# Patient Record
Sex: Male | Born: 2000 | Race: White | Hispanic: No | Marital: Single | Smoking: Never smoker
Health system: Southern US, Community
[De-identification: ages and names within clinical notes are randomized; demographics above are authoritative.]

## PROBLEM LIST (undated history)

## (undated) DIAGNOSIS — J45909 Unspecified asthma, uncomplicated: Secondary | ICD-10-CM

## (undated) DIAGNOSIS — K219 Gastro-esophageal reflux disease without esophagitis: Secondary | ICD-10-CM

## (undated) DIAGNOSIS — R51 Headache: Secondary | ICD-10-CM

## (undated) DIAGNOSIS — R519 Headache, unspecified: Secondary | ICD-10-CM

## (undated) HISTORY — PX: CIRCUMCISION: SUR203

## (undated) HISTORY — DX: Headache, unspecified: R51.9

## (undated) HISTORY — DX: Gastro-esophageal reflux disease without esophagitis: K21.9

## (undated) HISTORY — PX: COSMETIC SURGERY: SHX468

## (undated) HISTORY — DX: Headache: R51

---
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
## 2018-08-22 ENCOUNTER — Encounter (HOSPITAL_BASED_OUTPATIENT_CLINIC_OR_DEPARTMENT_OTHER): Payer: Self-pay | Admitting: *Deleted

## 2018-08-22 ENCOUNTER — Emergency Department (HOSPITAL_BASED_OUTPATIENT_CLINIC_OR_DEPARTMENT_OTHER)
Admission: EM | Admit: 2018-08-22 | Discharge: 2018-08-22 | Disposition: A | Discharge status: Home / Self Care | Payer: PRIVATE HEALTH INSURANCE | Attending: Emergency Medicine | Admitting: Emergency Medicine

## 2018-08-22 ENCOUNTER — Other Ambulatory Visit: Payer: Self-pay

## 2018-08-22 ENCOUNTER — Emergency Department (HOSPITAL_BASED_OUTPATIENT_CLINIC_OR_DEPARTMENT_OTHER): Payer: PRIVATE HEALTH INSURANCE

## 2018-08-22 DIAGNOSIS — Z79899 Other long term (current) drug therapy: Secondary | ICD-10-CM | POA: Insufficient documentation

## 2018-08-22 DIAGNOSIS — R109 Unspecified abdominal pain: Secondary | ICD-10-CM

## 2018-08-22 DIAGNOSIS — R1010 Upper abdominal pain, unspecified: Secondary | ICD-10-CM | POA: Insufficient documentation

## 2018-08-22 DIAGNOSIS — J45909 Unspecified asthma, uncomplicated: Secondary | ICD-10-CM | POA: Insufficient documentation

## 2018-08-22 LAB — COMPREHENSIVE METABOLIC PANEL
ALT: 20 U/L (ref 0–44)
AST: 23 U/L (ref 15–41)
Albumin: 4.9 g/dL (ref 3.5–5.0)
Alkaline Phosphatase: 47 U/L (ref 38–126)
Anion gap: 7 (ref 5–15)
BUN: 15 mg/dL (ref 6–20)
CHLORIDE: 103 mmol/L (ref 98–111)
CO2: 27 mmol/L (ref 22–32)
CREATININE: 0.84 mg/dL (ref 0.61–1.24)
Calcium: 9.3 mg/dL (ref 8.9–10.3)
Glucose, Bld: 114 mg/dL — ABNORMAL HIGH (ref 70–99)
POTASSIUM: 3.6 mmol/L (ref 3.5–5.1)
Sodium: 137 mmol/L (ref 135–145)
Total Bilirubin: 1 mg/dL (ref 0.3–1.2)
Total Protein: 7.6 g/dL (ref 6.5–8.1)

## 2018-08-22 LAB — CBC
HEMATOCRIT: 42.6 % (ref 39.0–52.0)
Hemoglobin: 14.9 g/dL (ref 13.0–17.0)
MCH: 29.2 pg (ref 26.0–34.0)
MCHC: 35 g/dL (ref 30.0–36.0)
MCV: 83.5 fL (ref 80.0–100.0)
NRBC: 0 % (ref 0.0–0.2)
Platelets: 193 10*3/uL (ref 150–400)
RBC: 5.1 MIL/uL (ref 4.22–5.81)
RDW: 12.9 % (ref 11.5–15.5)
WBC: 12.4 10*3/uL — AB (ref 4.0–10.5)

## 2018-08-22 LAB — URINALYSIS, ROUTINE W REFLEX MICROSCOPIC
Bilirubin Urine: NEGATIVE
Glucose, UA: NEGATIVE mg/dL
Hgb urine dipstick: NEGATIVE
KETONES UR: NEGATIVE mg/dL
LEUKOCYTES UA: NEGATIVE
NITRITE: NEGATIVE
PH: 5.5 (ref 5.0–8.0)
PROTEIN: 100 mg/dL — AB
Specific Gravity, Urine: >1.03 — ABNORMAL HIGH (ref 1.005–1.030)

## 2018-08-22 LAB — URINALYSIS, MICROSCOPIC (REFLEX): Bacteria, UA: NONE SEEN

## 2018-08-22 LAB — LIPASE, BLOOD: LIPASE: 30 U/L (ref 11–51)

## 2018-08-22 MED ORDER — LACTATED RINGERS IV BOLUS
1000.0000 mL | Freq: Once | INTRAVENOUS | Status: AC
  Administered 2018-08-22: 1000 mL via INTRAVENOUS

## 2018-08-22 MED ORDER — IOPAMIDOL (ISOVUE-300) INJECTION 61%
100.0000 mL | Freq: Once | INTRAVENOUS | Status: AC | PRN
  Administered 2018-08-22: 100 mL via INTRAVENOUS

## 2018-08-22 MED ORDER — ONDANSETRON 4 MG PO TBDP: 4.0000 mg | ORAL_TABLET | Freq: Three times a day (TID) | ORAL | 0 refills | Status: DC | PRN

## 2018-08-22 MED ORDER — SODIUM CHLORIDE 0.9% FLUSH
3.0000 mL | Freq: Once | INTRAVENOUS | Status: DC
  Filled 2018-08-22: qty 3

## 2018-08-22 MED ORDER — ONDANSETRON HCL 4 MG/2ML IJ SOLN
4.0000 mg | Freq: Once | INTRAMUSCULAR | Status: AC
  Administered 2018-08-22: 4 mg via INTRAVENOUS
  Filled 2018-08-22: qty 2

## 2018-08-22 NOTE — ED Notes (Signed)
Patient transported to CT 

## 2018-08-22 NOTE — Discharge Instructions (Addendum)
If you develop worsening, continued, or recurrent abdominal pain, uncontrolled vomiting, fever, chest or back pain, or any other new/concerning symptoms then return to the ER for evaluation.   Take tylenol and/or ibuprofen for your fever

## 2018-08-22 NOTE — ED Provider Notes (Signed)
[facility] EMERGENCY DEPARTMENT Provider Note   CSN: [id number] Arrival date & time: 08/22/18  1656     History   Chief Complaint Chief Complaint  Patient presents with  . Abdominal Pain    HPI Arun Baggarly is a 18 y.o. male.  HPI  18 year old male presents with abdominal pain.  He was sent over from urgent care to rule out appendicitis.  He states he woke up early this morning with some upper abdominal discomfort.  Took some Tums and went back to bed but when he woke back up the pain was still present.  It is about a 6 out of 10 and seems to be waxing and waning.  No nausea, vomiting, diarrhea or constipation.  He states an x-ray was obtained at urgent care and they did lab work that was concerning for infection.  The x-ray told him that he was constipated though he states he has not felt constipated.  No fevers.   Past Medical History:  Diagnosis Date  . Asthma   . Headache     Patient Active Problem List   Diagnosis Date Noted  . Postconcussion syndrome 04/28/2014    Past Surgical History:  Procedure Laterality Date  . CIRCUMCISION  2000  . FACIAL COSMETIC SURGERY  Nov. 2007   [facility]  . MOUTH SURGERY  Nov. 2007   [facility] Medications    Prior to Admission medications   Medication Sig Start Date End Date Taking? Authorizing Provider  albuterol (PROVENTIL HFA;VENTOLIN HFA) 108 (90 BASE) MCG/ACT inhaler Inhale 2 puffs into the lungs every 6 (six) hours as needed for wheezing.   Yes [provider]  amitriptyline (ELAVIL) 25 MG tablet Take 1 tablet (25 mg total) by mouth at bedtime. 06/09/14   Keturah Shavers, MD  Magnesium Oxide 500 MG TABS Take by mouth.    [provider]  Melatonin 5 MG TABS Take by mouth.    [provider]  neomycin-polymyxin-hydrocortisone (CORTISPORIN) 3.5-10000-1 otic suspension Place 3 drops into the right ear 3 (three) times daily. 01/24/13   Hayden Rasmussen, NP    promethazine (PHENERGAN) 25 MG tablet  05/31/14   [provider]  riboflavin (VITAMIN B-2) 100 MG TABS tablet Take 100 mg by mouth daily.    [provider]    Family History Family History  Problem Relation Age of Onset  . Heart Problems Maternal Grandmother        Died at 38  . Heart Problems Paternal Grandfather        Died at 55    Social History Social History   Tobacco Use  . Smoking status: Never Smoker  . Smokeless tobacco: Never Used  Substance Use Topics  . Alcohol use: No  . Drug use: No     Allergies   Patient has no known allergies.   Review of Systems Review of Systems  Constitutional: Negative for fever.  Gastrointestinal: Positive for abdominal pain. Negative for constipation, diarrhea, nausea and vomiting.  All other systems reviewed and are negative.    Physical Exam Updated Vital Signs BP 122/66 (BP Location: Right Arm)   Pulse 88   Temp 100.3 F (37.9 C) (Oral)   Resp 18   Ht 5\' 10"  (1.778 m)   Wt 87.5 kg   SpO2 100%   BMI 27.69 kg/m   Physical Exam Vitals signs and nursing note reviewed.  Constitutional:  Appearance: He is well-developed.  HENT:     Head: Normocephalic and atraumatic.     Right Ear: External ear normal.     Left Ear: External ear normal.     Nose: Nose normal.  Eyes:     General:        Right eye: No discharge.        Left eye: No discharge.  Neck:     Musculoskeletal: Neck supple.  Cardiovascular:     Rate and Rhythm: Regular rhythm. Tachycardia present.     Heart sounds: Normal heart sounds.  Pulmonary:     Effort: Pulmonary effort is normal.     Breath sounds: Normal breath sounds.  Abdominal:     Palpations: Abdomen is soft.     Tenderness: There is abdominal tenderness (mild, diffuse lower abdominal) in the right lower quadrant, suprapubic area and left lower quadrant.  Skin:    General: Skin is warm and dry.  Neurological:     Mental Status: He is alert.  Psychiatric:         Mood and Affect: Mood is not anxious.      ED Treatments / Results  Labs (all labs ordered are listed, but only abnormal results are displayed) Labs Reviewed  COMPREHENSIVE METABOLIC PANEL - Abnormal; Notable for the following components:      Result Value   Glucose, Bld 114 (*)    All other components within normal limits  CBC - Abnormal; Notable for the following components:   WBC 12.4 (*)    All other components within normal limits  URINALYSIS, ROUTINE W REFLEX MICROSCOPIC - Abnormal; Notable for the following components:   APPearance HAZY (*)    Specific Gravity, Urine >1.030 (*)    Protein, ur 100 (*)    All other components within normal limits  LIPASE, BLOOD  URINALYSIS, MICROSCOPIC (REFLEX)    EKG None  Radiology Ct Abdomen Pelvis W Contrast  Result Date: 08/22/2018 CLINICAL DATA:  Awoke with mid abdominal pain. Elevated white count. Low-grade fever. EXAM: CT ABDOMEN AND PELVIS WITH CONTRAST TECHNIQUE: Multidetector CT imaging of the abdomen and pelvis was performed using the standard protocol following bolus administration of intravenous contrast. CONTRAST:  ISOVUE-300 IOPAMIDOL (ISOVUE-300) INJECTION 61% COMPARISON:  Radiography same day FINDINGS: Lower chest: Normal Hepatobiliary: Normal Pancreas: Normal Spleen: Normal Adrenals/Urinary Tract: Adrenal glands are normal. Kidneys are normal. Bladder is normal. Stomach/Bowel: No abnormal bowel finding. No sign of obstruction or inflammation. The cecum is low-lying in the pelvis and the appendix is along the right pelvic sidewall and appears normal. Vascular/Lymphatic: Normal Reproductive: Normal Other: No free fluid or air. Musculoskeletal: Normal IMPRESSION: Normal examination. No abnormality seen to explain the presenting symptoms. Specifically, no sign of appendicitis. Low lying cecum. Patient does not appear to have an abnormal amount of fecal matter. Electronically Signed   By: Paulina Fusi M.D.   On: 08/22/2018  18:33    Procedures Procedures (including critical care time)  Medications Ordered in ED Medications  sodium chloride flush (NS) 0.9 % injection 3 mL (3 mLs Intravenous Not Given 08/22/18 1739)  ondansetron (ZOFRAN) injection 4 mg (has no administration in time range)  lactated ringers bolus 1,000 mL ( Intravenous Stopped 08/22/18 1756)  iopamidol (ISOVUE-300) 61 % injection 100 mL (100 mLs Intravenous Contrast Given 08/22/18 1759)     Initial Impression / Assessment and Plan / ED Course  I have reviewed the triage vital signs and the nursing notes.  Pertinent labs &  imaging results that were available during my care of the patient were reviewed by me and considered in my medical decision making (see chart for details).     Patient has some vague abdominal discomfort but no localizing tenderness.  Given the low-grade fever and tachycardia, CT was obtained but does not show an obvious appendicitis or other acute intra-abdominal emergency.  He is feeling nauseated currently and while he has not had diarrhea, this could be the start of a viral GI illness.  I discussed that CT imaging is not perfect and that if he were to develop new or worsening or continued symptoms he should return to the ER for evaluation.  Otherwise discharged home with antiemetics.  Final Clinical Impressions(s) / ED Diagnoses   Final diagnoses:  Abdominal pain, unspecified abdominal location    ED Discharge Orders    None       Pricilla LovelessGoldston, Henrine Hayter, MD 08/22/18 1910

## 2018-08-22 NOTE — ED Triage Notes (Addendum)
Woke with abdominal pain. He was seen at [facility] and told he is constipated and should come here to be checked for appendicitis.

## 2019-09-14 IMAGING — CT CT ABD-PELV W/ CM
2 of 4 series · 17 of 46 positions shown, 19 images · IV contrast (iopamidol)
Comparison: Radiography same day

CLINICAL DATA: Awoke with mid abdominal pain. Elevated white count.
Low-grade fever.

EXAM:
CT ABDOMEN AND PELVIS WITH CONTRAST
TECHNIQUE: Multidetector CT imaging of the abdomen and pelvis was performed
using the standard protocol following bolus administration of
intravenous contrast.
CONTRAST:  100mL 3F80HO-0EE IOPAMIDOL (3F80HO-0EE) INJECTION 61%

[Series 2: axial st · axial · 0.80mm/px · z∈[-545,-105]mm · 14 of 96 slices shown, 16 images]
[im 4/96  soft-tissue]
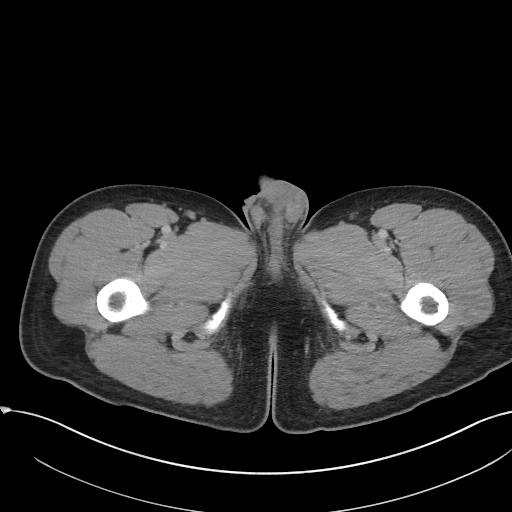
[im 4/96  bone]
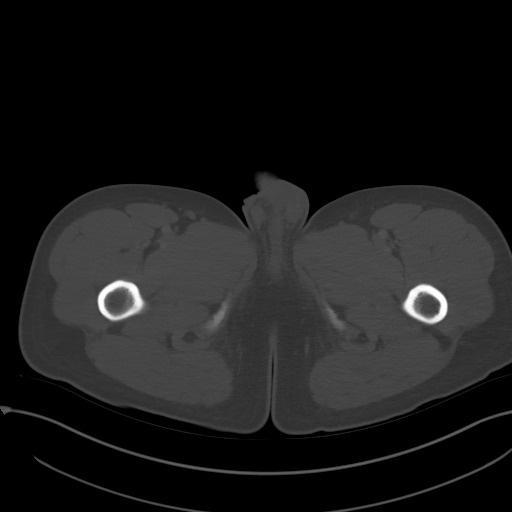
[im 12/96  soft-tissue]
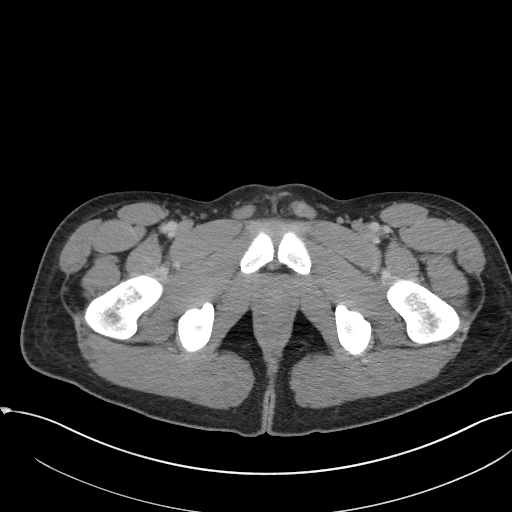
[im 20/96  soft-tissue]
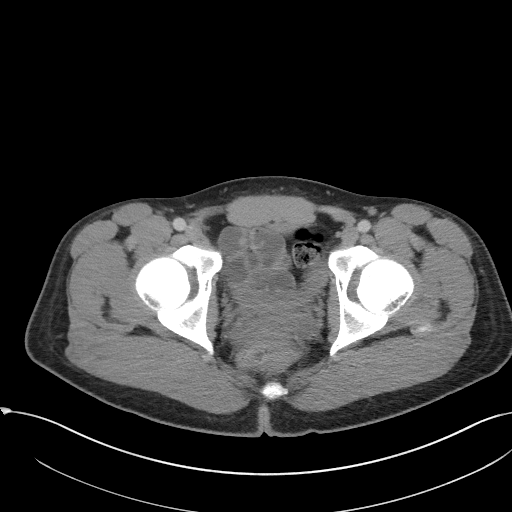
[im 27/96  soft-tissue]
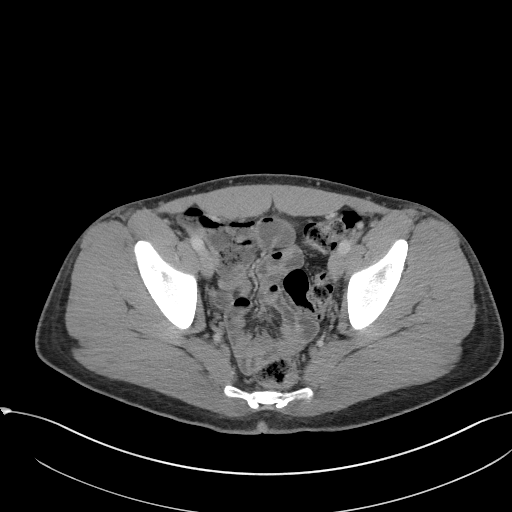
[im 31/96  soft-tissue]
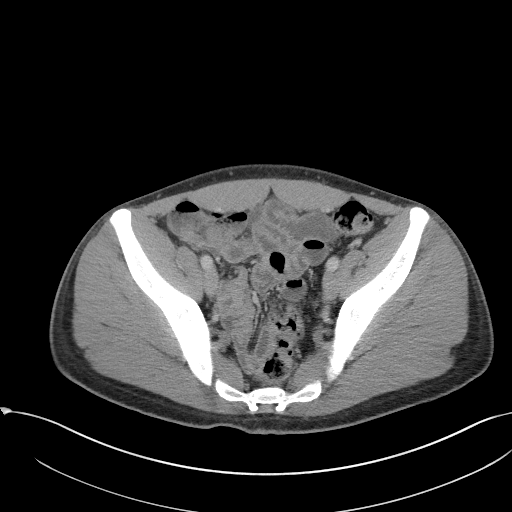
[im 39/96  soft-tissue]
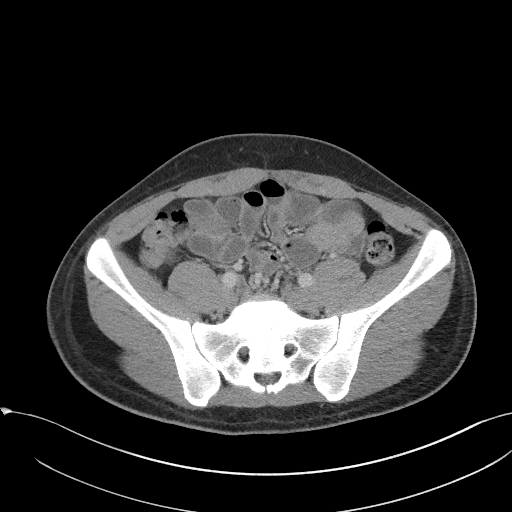
[im 46/96  soft-tissue]
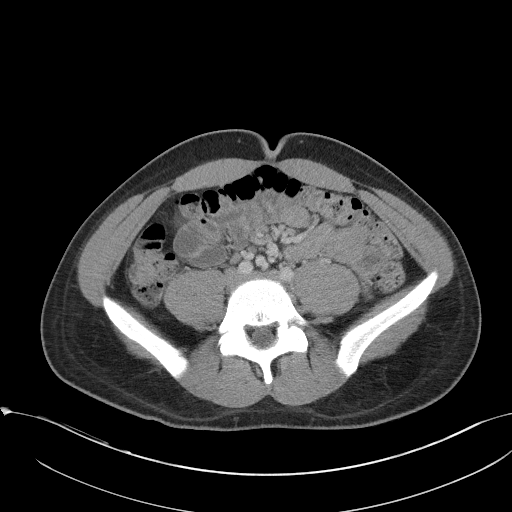
[im 50/96  soft-tissue]
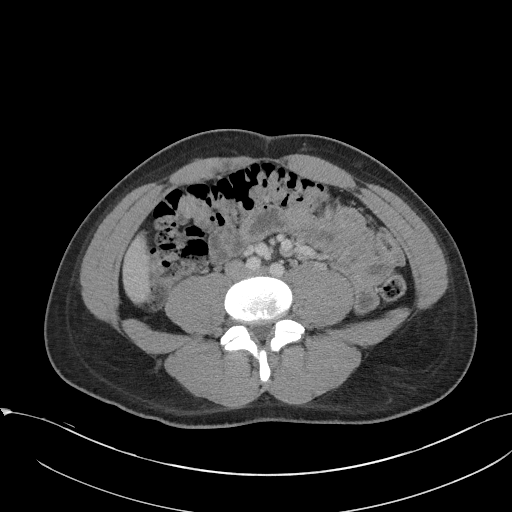
[im 58/96  soft-tissue]
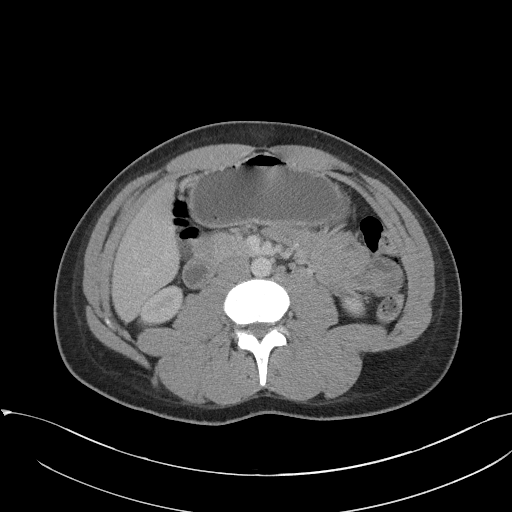
[im 58/96  bone]
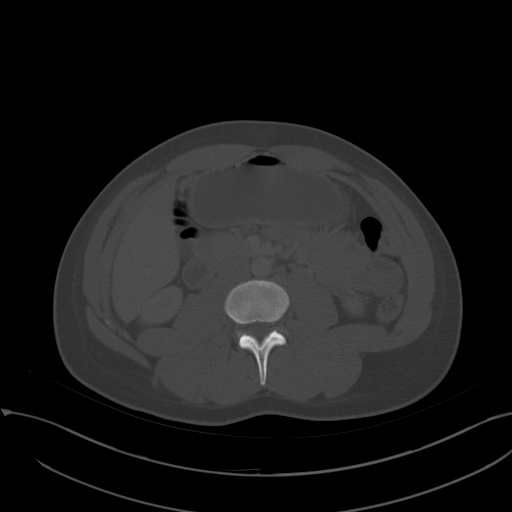
[im 65/96  soft-tissue]
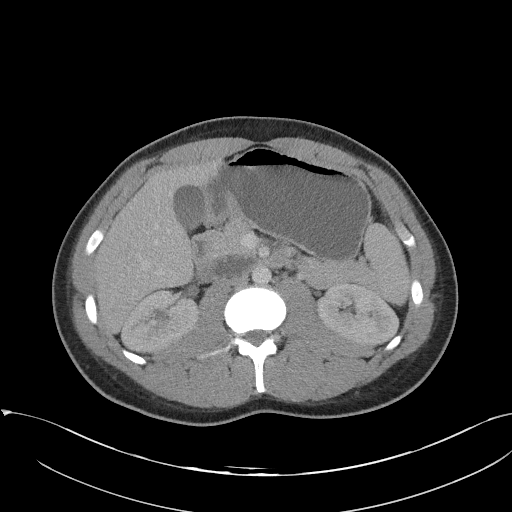
[im 73/96  soft-tissue]
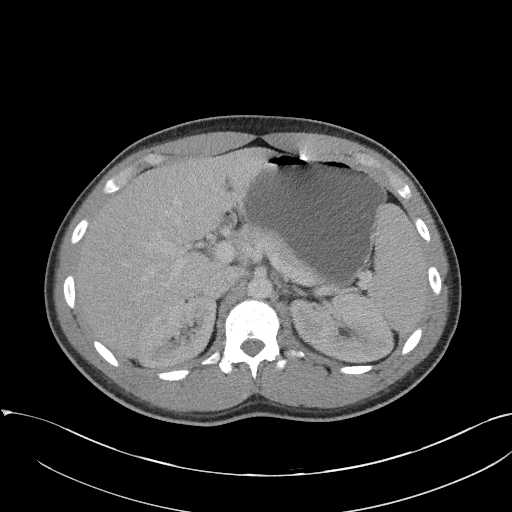
[im 77/96  soft-tissue]
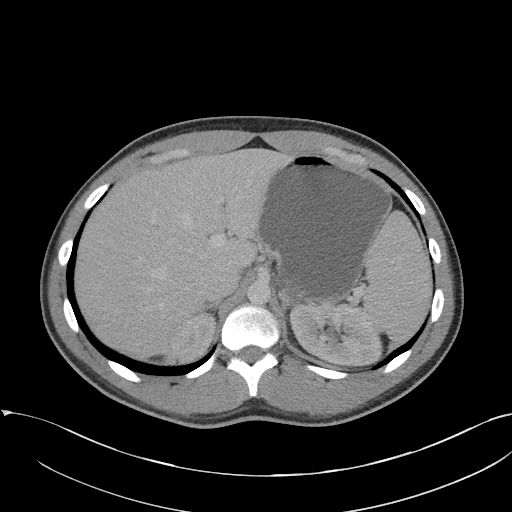
[im 84/96  soft-tissue]
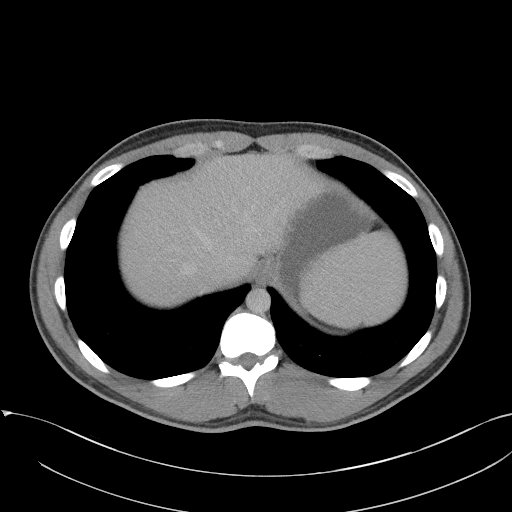
[im 92/96  soft-tissue]
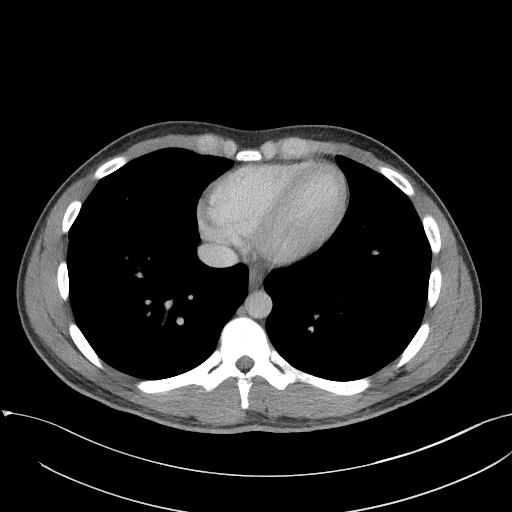

[Series 5: coronal st · coronal · 0.83mm/px · 3 of 89 slices shown]
[im 30/89  soft-tissue]
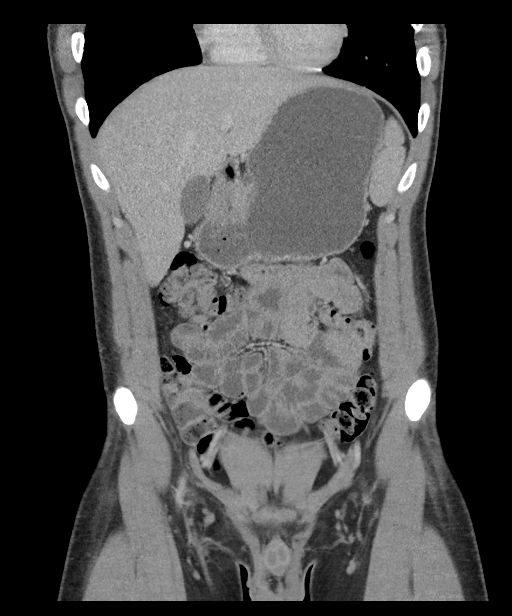
[im 40/89  soft-tissue]
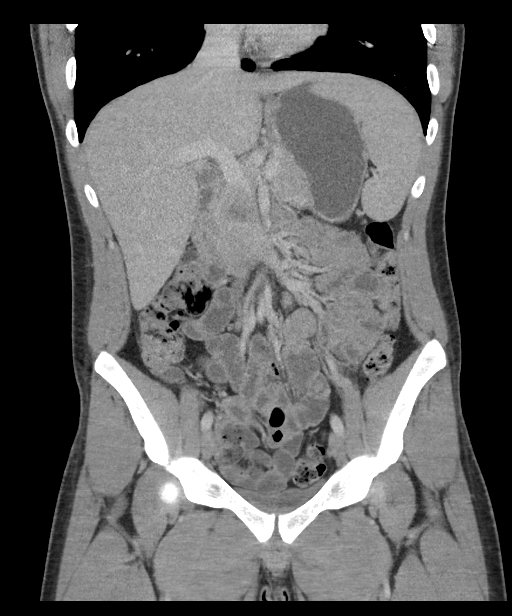
[im 49/89  soft-tissue]
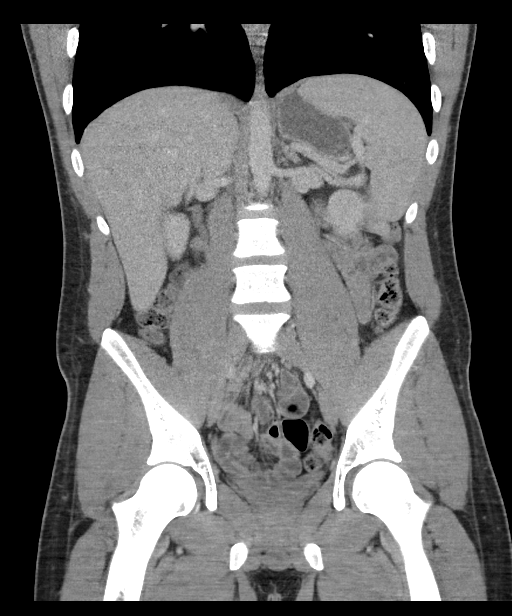

[17 of 46 positions shown; findings below may reference images not displayed]

FINDINGS: Lower chest: Normal

Hepatobiliary: Normal

Pancreas: Normal

Spleen: Normal

Adrenals/Urinary Tract: Adrenal glands are normal. Kidneys are
normal. Bladder is normal.

Stomach/Bowel: No abnormal bowel finding. No sign of obstruction or
inflammation. The cecum is low-lying in the pelvis and the appendix
is along the right pelvic sidewall and appears normal.

Vascular/Lymphatic: Normal

Reproductive: Normal

Other: No free fluid or air.

Musculoskeletal: Normal
IMPRESSION: Normal examination. No abnormality seen to explain the presenting
symptoms. Specifically, no sign of appendicitis. Low lying cecum.
Patient does not appear to have an abnormal amount of fecal matter.

## 2021-12-02 ENCOUNTER — Ambulatory Visit (HOSPITAL_COMMUNITY)
Admission: EM | Admit: 2021-12-02 | Discharge: 2021-12-02 | Disposition: A | Discharge status: Home / Self Care | Payer: Managed Care, Other (non HMO) | Attending: Physician Assistant | Admitting: Physician Assistant

## 2021-12-02 ENCOUNTER — Encounter (HOSPITAL_COMMUNITY): Payer: Self-pay | Admitting: Emergency Medicine

## 2021-12-02 DIAGNOSIS — S91331A Puncture wound without foreign body, right foot, initial encounter: Secondary | ICD-10-CM

## 2021-12-02 DIAGNOSIS — Z23 Encounter for immunization: Secondary | ICD-10-CM | POA: Diagnosis not present

## 2021-12-02 MED ORDER — TETANUS-DIPHTH-ACELL PERTUSSIS 5-2.5-18.5 LF-MCG/0.5 IM SUSY
0.5000 mL | PREFILLED_SYRINGE | Freq: Once | INTRAMUSCULAR | Status: AC
  Administered 2021-12-02: 0.5 mL via INTRAMUSCULAR

## 2021-12-02 MED ORDER — TETANUS-DIPHTH-ACELL PERTUSSIS 5-2.5-18.5 LF-MCG/0.5 IM SUSY
PREFILLED_SYRINGE | INTRAMUSCULAR | Status: AC
  Filled 2021-12-02: qty 0.5

## 2021-12-02 MED ORDER — CIPROFLOXACIN HCL 500 MG PO TABS: 500.0000 mg | ORAL_TABLET | Freq: Two times a day (BID) | ORAL | 0 refills | Status: DC

## 2021-12-02 NOTE — ED Provider Notes (Signed)
?[place] ? ? ? ?CSN: [id number] ?Arrival date & time: 12/02/21  1500 ? ? ?  ? ?History   ?Chief Complaint ?No chief complaint on file. ? ? ?HPI ?Jose Humphrey is a 21 y.o. male.  ? ?Patient presents today following puncture wound to the bottom of his right foot.  Reports that he was walking in shoes when he stepped on a nail that punctured the bottom of his shoe and into the sole of his right foot.  He is sure that the entirety of the nail was removed and has no concern for retained foreign body.  He reports pain is minimal.  He is able to ambulate unassisted.  Denies any swelling, numbness, paresthesias, fever, nausea, vomiting.  He denies any recent antibiotic use.  Denies history of diabetes or immunosuppression.  He is unsure when his last tetanus was.  Need to clean the foot with soap and water and apply hydrogen peroxide and alcohol before dressing it with a Band-Aid. ? ? ?Past Medical History:  ?Diagnosis Date  ? Asthma   ? Headache   ? ? ?Patient Active Problem List  ? Diagnosis Date Noted  ? Postconcussion syndrome 04/28/2014  ? ? ?Past Surgical History:  ?Procedure Laterality Date  ? CIRCUMCISION  2000  ? FACIAL COSMETIC SURGERY  Nov. 2007  ? [place]  Nov. 2007  ? [facility]  ? ? ? ? ? ?Home Medications   ? ?Prior to Admission medications   ?Medication Sig Start Date End Date Taking? Authorizing Provider  ?ciprofloxacin (CIPRO) 500 MG tablet Take 1 tablet (500 mg total) by mouth every 12 (twelve) hours. 12/02/21  Yes Cristino Degroff K, PA-C  ?albuterol (PROVENTIL HFA;VENTOLIN HFA) 108 (90 BASE) MCG/ACT inhaler Inhale 2 puffs into the lungs every 6 (six) hours as needed for wheezing.    [provider]  ?promethazine (PHENERGAN) 25 MG tablet  05/31/14   [provider]  ? ? ?Family History ?Family History  ?Problem Relation Age of Onset  ? Heart Problems Maternal Grandmother   ?     Died at 72  ? Heart Problems Paternal Grandfather   ?     Died  at 44  ? ? ?Social History ?Social History  ? ?Tobacco Use  ? Smoking status: Never  ? Smokeless tobacco: Never  ?Substance Use Topics  ? Alcohol use: No  ? Drug use: No  ? ? ? ?Allergies   ?Patient has no known allergies. ? ? ?Review of Systems ?Review of Systems  ?Constitutional:  Negative for activity change, appetite change, fatigue and fever.  ?Gastrointestinal:  Negative for abdominal pain, diarrhea, nausea and vomiting.  ?Musculoskeletal:  Negative for arthralgias and myalgias.  ?Skin:  Positive for wound. Negative for color change.  ?Neurological:  Negative for dizziness, weakness, light-headedness, numbness and headaches.  ? ? ?Physical Exam ?Triage Vital Signs ?ED Triage Vitals  ?Enc Vitals Group  ?   BP 12/02/21 1541 113/78  ?   Pulse Rate 12/02/21 1541 80  ?   Resp 12/02/21 1541 17  ?   Temp 12/02/21 1541 98.6 ?F (37 ?C)  ?   Temp src --   ?   SpO2 12/02/21 1541 98 %  ?   Weight --   ?   Height --   ?   Head Circumference --   ?   Peak Flow --   ?   Pain Score 12/02/21 1537 2  ?  Pain Loc --   ?   Pain Edu? --   ?   Excl. in [place]? --   ? ?No data found. ? ?Updated Vital Signs ?BP 113/78 (BP Location: Left Arm)   Pulse 80   Temp 98.6 ?F (37 ?C)   Resp 17   SpO2 98%  ? ?Visual Acuity ?Right Eye Distance:   ?Left Eye Distance:   ?Bilateral Distance:   ? ?Right Eye Near:   ?Left Eye Near:    ?Bilateral Near:    ? ?Physical Exam ?Vitals reviewed.  ?Constitutional:   ?   General: He is awake.  ?   Appearance: Normal appearance. He is well-developed. He is not ill-appearing.  ?   Comments: Very pleasant male appears stated age in no acute distress sitting comfortably in exam room  ?HENT:  ?   Head: Normocephalic and atraumatic.  ?   Mouth/Throat:  ?   Pharynx: No oropharyngeal exudate, posterior oropharyngeal erythema or uvula swelling.  ?Cardiovascular:  ?   Rate and Rhythm: Normal rate and regular rhythm.  ?   Pulses:     ?     Posterior tibial pulses are 2+ on the right side.  ?   Heart sounds: Normal  heart sounds, S1 normal and S2 normal. No murmur heard. ?   Comments: Capillary refill within 2 seconds right toes ?Pulmonary:  ?   Effort: Pulmonary effort is normal.  ?   Breath sounds: Normal breath sounds. No stridor. No wheezing, rhonchi or rales.  ?Musculoskeletal:  ?   Right lower leg: No edema.  ?   Left lower leg: No edema.  ?   Right foot: Normal range of motion. No deformity.  ?     Feet: ? ?Feet:  ?   Right foot:  ?   Protective Sensation: 10 sites tested.  10 sites sensed.  ?   Skin integrity: No ulcer, blister or skin breakdown.  ?   Toenail Condition: Right toenails are normal.  ?Neurological:  ?   Mental Status: He is alert.  ?Psychiatric:     ?   Behavior: Behavior is cooperative.  ? ? ? ?UC Treatments / Results  ?Labs ?(all labs ordered are listed, but only abnormal results are displayed) ?Labs Reviewed - No data to display ? ?EKG ? ? ?Radiology ?No results found. ? ?Procedures ?Procedures (including critical care time) ? ?Medications Ordered in UC ?Medications  ?Tdap (BOOSTRIX) injection 0.5 mL (0.5 mLs Intramuscular Given 12/02/21 1553)  ? ? ?Initial Impression / Assessment and Plan / UC Course  ?I have reviewed the triage vital signs and the nursing notes. ? ?Pertinent labs & imaging results that were available during my care of the patient were reviewed by me and considered in my medical decision making (see chart for details). ? ?  ? ?No indication for primary closure as wound is not small without active bleeding.  Will update tetanus today.  Given nail penetrated the sole of his shoe and into his foot will cover for Pseudomonas with ciprofloxacin 500 mg twice daily for 5 days.  Recommend he keep area clean with soap and water.  Discussed that if he develops any swelling, redness, pain, fever, nausea, vomiting he should be seen immediately.  Strict return precautions given to which he expressed understanding. ? ?Final Clinical Impressions(s) / UC Diagnoses  ? ?Final diagnoses:  ?Puncture wound  of plantar aspect of right foot, initial encounter  ? ? ? ?Discharge Instructions   ? ?  ?  We updated your tetanus today.  Start ciprofloxacin 500 mg twice daily for 5 days.  Keep area clean with soap and water.  If you have any worsening symptoms including drainage, bleeding, pain, swelling, fever, nausea, vomiting you should be seen immediately. ? ? ? ? ?ED Prescriptions   ? ? Medication Sig Dispense Auth. Provider  ? ciprofloxacin (CIPRO) 500 MG tablet Take 1 tablet (500 mg total) by mouth every 12 (twelve) hours. 10 tablet Veer Elamin K, PA-C  ? ?  ? ?PDMP not reviewed this encounter. ?  ?Terrilee Croak, PA-C ?12/02/21 1557 ? ?

## 2021-12-02 NOTE — ED Triage Notes (Signed)
Pt reports that stepped on nail about hour to two hours ago. Pain when walking, unsure when tetanus shot was.  ?

## 2021-12-02 NOTE — Discharge Instructions (Signed)
We updated your tetanus today.  Start ciprofloxacin 500 mg twice daily for 5 days.  Keep area clean with soap and water.  If you have any worsening symptoms including drainage, bleeding, pain, swelling, fever, nausea, vomiting you should be seen immediately. ?

## 2023-05-28 ENCOUNTER — Other Ambulatory Visit (HOSPITAL_BASED_OUTPATIENT_CLINIC_OR_DEPARTMENT_OTHER): Payer: Self-pay

## 2023-05-28 MED ORDER — FLULAVAL 0.5 ML IM SUSY
0.5000 mL | PREFILLED_SYRINGE | Freq: Once | INTRAMUSCULAR | 0 refills | Status: AC
  Filled 2023-05-28: qty 0.5, 1d supply, fill #0

## 2023-10-22 ENCOUNTER — Ambulatory Visit: Payer: PRIVATE HEALTH INSURANCE | Admitting: Physician Assistant

## 2023-11-11 ENCOUNTER — Ambulatory Visit
Admission: EM | Admit: 2023-11-11 | Discharge: 2023-11-11 | Disposition: A | Discharge status: Home / Self Care | Attending: Physician Assistant | Admitting: Physician Assistant

## 2023-11-11 ENCOUNTER — Other Ambulatory Visit: Payer: Self-pay

## 2023-11-11 DIAGNOSIS — R0789 Other chest pain: Secondary | ICD-10-CM | POA: Diagnosis not present

## 2023-11-11 DIAGNOSIS — G44209 Tension-type headache, unspecified, not intractable: Secondary | ICD-10-CM | POA: Diagnosis not present

## 2023-11-11 MED ORDER — ACETAMINOPHEN 325 MG PO TABS
650.0000 mg | ORAL_TABLET | Freq: Once | ORAL | Status: AC
  Administered 2023-11-11: 650 mg via ORAL

## 2023-11-11 MED ORDER — ALUM & MAG HYDROXIDE-SIMETH 200-200-20 MG/5ML PO SUSP
30.0000 mL | Freq: Once | ORAL | Status: AC
  Administered 2023-11-11: 30 mL via ORAL

## 2023-11-11 NOTE — ED Notes (Addendum)
 This RN made patient rounds at this time. Pt states his head is still causing him pain however his chest feels better post medication administration. Erin PA made aware.

## 2023-11-11 NOTE — ED Triage Notes (Addendum)
 Pt presents with complaints of intermittent left-sided chest pains since Friday 4/11. Pt currently rates his pain a 6/10, has a headache. OTC Tylenol taken with little relief. Shortness of breath also reported when the chest pain occurs. Describes chest pain as sharp, radiates into the neck & left shoulder.

## 2023-11-11 NOTE — ED Notes (Signed)
 This RN reviewed AVS at this time. Pt voices concerns regarding his BP reading today. [place] BP was 150/72. BP monitor at home. Plans to schedule an appointment with primary care. This RN offered to make an appointment, pt denied.

## 2023-11-11 NOTE — ED Provider Notes (Signed)
 [facility]    CSN: [id number] Arrival date & time: 11/11/23  1821      History   Chief Complaint Chief Complaint  Patient presents with   Chest Pain    HPI Jose Humphrey is a 23 y.o. male.   HPI   Patient presents today with concerns for chest pain that started Friday night 11/08/23 and recurred again the next 2 nights He reports current chest soreness and states the pain typically comes at night He reports he is not currently having the active pain that has been concerning him but reports pain in the back of his and left temple He has been able to eat and drink today without issues Reports pain is in the substernal area and feels sharp and throbbing with some pressure sensation  He reports some tension in left shoulder and also reports a headache and lightheadedness  He reports when he laid down for bed  on 11/08/23 the chest pain did not resolve until he went to sleep and when he woke up everything seemed fine but pain returned  He states this happened again last night   He does not recall ever having something like this previously  He states he does have a hx of reflux and initially thought it was this but after he developed headache and tingling in left arm he started to get more concerned  Interventions: Tylenol without much relief. He has not taken anything for pain today   Past Medical History:  Diagnosis Date   Asthma    Headache     Patient Active Problem List   Diagnosis Date Noted   Postconcussion syndrome 04/28/2014    Past Surgical History:  Procedure Laterality Date   CIRCUMCISION  01-29-99   FACIAL COSMETIC SURGERY  Nov. 2007   [facility]   MOUTH SURGERY  Nov. 2007   [facility] Medications    Prior to Admission medications   Medication Sig Start Date End Date Taking? Authorizing Provider  albuterol (PROVENTIL HFA;VENTOLIN HFA) 108 (90 BASE) MCG/ACT inhaler Inhale 2 puffs into the lungs every 6 (six)  hours as needed for wheezing.    [provider]  ciprofloxacin (CIPRO) 500 MG tablet Take 1 tablet (500 mg total) by mouth every 12 (twelve) hours. 12/02/21   Raspet, Beila Purdie K, PA-C  promethazine (PHENERGAN) 25 MG tablet  05/31/14   [provider]    Family History Family History  Problem Relation Age of Onset   Heart Problems Maternal Grandmother        Died at 68   Heart Problems Paternal Grandfather        Died at 48    Social History Social History   Tobacco Use   Smoking status: Never   Smokeless tobacco: Never  Vaping Use   Vaping status: Never Used  Substance Use Topics   Alcohol use: No   Drug use: No     Allergies   Patient has no known allergies.   Review of Systems Review of Systems  Constitutional:  Negative for chills and fever.  Eyes:  Negative for visual disturbance.  Respiratory:  Positive for shortness of breath (with chest pain).   Cardiovascular:  Positive for chest pain.  Gastrointestinal:  Negative for abdominal pain, constipation, diarrhea, nausea and vomiting.  Neurological:  Positive for light-headedness, numbness (tingling on left arm and right foot) and headaches. Negative for dizziness and speech difficulty.  Psychiatric/Behavioral:  Negative  for confusion.      Physical Exam Triage Vital Signs ED Triage Vitals  Encounter Vitals Group     BP 11/11/23 1833 (!) 151/76     Systolic BP Percentile --      Diastolic BP Percentile --      Pulse Rate 11/11/23 1833 88     Resp 11/11/23 1833 18     Temp 11/11/23 1833 98.5 F (36.9 C)     Temp Source 11/11/23 1833 Oral     SpO2 11/11/23 1833 98 %     Weight 11/11/23 1832 195 lb (88.5 kg)     Height 11/11/23 1832 5\' 10"  (1.778 m)     Head Circumference --      Peak Flow --      Pain Score 11/11/23 1831 6     Pain Loc --      Pain Education --      Exclude from Growth Chart --    No data found.  Updated Vital Signs BP (!) 150/72 (BP Location: Right Arm)   Pulse 88    Temp 98.5 F (36.9 C) (Oral)   Resp 18   Ht 5\' 10"  (1.778 m)   Wt 195 lb (88.5 kg)   SpO2 98%   BMI 27.98 kg/m   Visual Acuity Right Eye Distance:   Left Eye Distance:   Bilateral Distance:    Right Eye Near:   Left Eye Near:    Bilateral Near:     Physical Exam Vitals reviewed.  Constitutional:      General: He is awake.     Appearance: Normal appearance. He is well-developed and well-groomed.  HENT:     Head: Normocephalic and atraumatic.  Cardiovascular:     Rate and Rhythm: Normal rate and regular rhythm.     Pulses:          Radial pulses are 2+ on the right side and 2+ on the left side.     Heart sounds: Normal heart sounds. Heart sounds not distant. No murmur heard.    No friction rub. No gallop.  Pulmonary:     Effort: Pulmonary effort is normal.     Breath sounds: Normal breath sounds. No decreased breath sounds, wheezing, rhonchi or rales.  Chest:     Chest wall: No mass, tenderness or crepitus.  Musculoskeletal:     Cervical back: Normal range of motion and neck supple. Normal range of motion.     Right lower leg: No edema.     Left lower leg: No edema.  Skin:    General: Skin is warm and dry.  Neurological:     General: No focal deficit present.     Mental Status: He is alert and oriented to person, place, and time.  Psychiatric:        Mood and Affect: Mood normal.        Behavior: Behavior normal. Behavior is cooperative.      UC Treatments / Results  Labs (all labs ordered are listed, but only abnormal results are displayed) Labs Reviewed - No data to display  EKG   Radiology No results found.  Procedures ED EKG  Date/Time: 11/11/2023 6:42 PM  Performed by: Providence Crosby, PA-C Authorized by: Providence Crosby, PA-C   Previous ECG:    Previous ECG:  Unavailable Interpretation:    Interpretation: normal   Rate:    ECG rate:  80   ECG rate assessment: normal   Rhythm:  Rhythm: sinus rhythm   QRS:    QRS intervals:  Normal   QRS  conduction: normal   ST segments:    ST segments:  Normal  (including critical care time)  Medications Ordered in UC Medications  acetaminophen (TYLENOL) tablet 650 mg (650 mg Oral Given 11/11/23 1909)  alum & mag hydroxide-simeth (MAALOX/MYLANTA) 200-200-20 MG/5ML suspension 30 mL (30 mLs Oral Given 11/11/23 1909)    Initial Impression / Assessment and Plan / UC Course  I have reviewed the triage vital signs and the nursing notes.  Pertinent labs & imaging results that were available during my care of the patient were reviewed by me and considered in my medical decision making (see chart for details).      Final Clinical Impressions(s) / UC Diagnoses   Final diagnoses:  Other chest pain  Acute non intractable tension-type headache   Patient presents today with concerns for substernal chest pain for the past 3 days that appears to get worse at night as well as headache and left shoulder pain.  EKG completed and appears overall normal.  EKG shows normal sinus rhythm with rate of approximately 80 bpm and without obvious signs of ST elevation or depression.  I reviewed with patient that the EKG is not completely definitive for cardiac etiology rule out and if he is still concerned about this he should go to the emergency room for troponins.  Patient declines EMS transport and is amenable to trying GI cocktail and Tylenol to assist with current symptoms of headache and mild chest pain.  Maalox was administered  as well as Tylenol 650 mg p.o. once.  He reports improvement in chest pain symptoms but headache pain did not appear affected.  At this time I suspect his chest pain is likely secondary to GERD and reflux with presentation and HPI.  Recommend famotidine or Prilosec per preference as well as Tums or Pepto for acute flares.  At this time I suspect potential tension type headache and recommend NSAIDs once he gets home as well as warm compresses and gentle massage.  Strict ED and return  precautions were reviewed and provided in after visit summary.  Follow-up as needed    Discharge Instructions      You were seen today for chest pain and headaches as well as shortness of breath.  Your EKG is reassuring but as discussed I cannot completely rule out a cardiac cause of your symptoms without blood work.  These labs are performed in the emergency room and if you are continuing to have concerns I recommend going there for further evaluation and monitoring. At this time I suspect that you are likely having reflux a tension headache. Your chest symptoms seem to improve after you were given Maalox which is a good sign and consistent with likely GERD.  I recommend starting a preventative medication such as Pepcid or Prilosec to assist with GERD prevention.  You can continue to take Tums as needed for acute symptoms. Tension headaches are largely muscular in nature and typically respond better to medications like ibuprofen or Motrin (NSAIDs) as well as warm compresses to the neck and shoulders, gentle stretches and massage.  I recommend trying these once you get home. If your symptoms seem to be getting worse, the chest pain does not improve with GERD medications or seems to be worsening, you develop shortness of breath or trouble breathing, more severe headache, severe headache of your life, difficulty speaking, confusion, vision changes or facial drooping please  go to the emergency room as these could be signs of a medical emergency.     ED Prescriptions   None    PDMP not reviewed this encounter.   Williom Cedar E, PA-C 11/11/23 2041

## 2023-11-11 NOTE — Discharge Instructions (Addendum)
 You were seen today for chest pain and headaches as well as shortness of breath.  Your EKG is reassuring but as discussed I cannot completely rule out a cardiac cause of your symptoms without blood work.  These labs are performed in the emergency room and if you are continuing to have concerns I recommend going there for further evaluation and monitoring. At this time I suspect that you are likely having reflux a tension headache. Your chest symptoms seem to improve after you were given Maalox which is a good sign and consistent with likely GERD.  I recommend starting a preventative medication such as Pepcid or Prilosec to assist with GERD prevention.  You can continue to take Tums as needed for acute symptoms. Tension headaches are largely muscular in nature and typically respond better to medications like ibuprofen or Motrin (NSAIDs) as well as warm compresses to the neck and shoulders, gentle stretches and massage.  I recommend trying these once you get home. If your symptoms seem to be getting worse, the chest pain does not improve with GERD medications or seems to be worsening, you develop shortness of breath or trouble breathing, more severe headache, severe headache of your life, difficulty speaking, confusion, vision changes or facial drooping please go to the emergency room as these could be signs of a medical emergency.

## 2023-11-15 ENCOUNTER — Other Ambulatory Visit: Payer: Self-pay | Admitting: Medical Genetics

## 2023-11-20 ENCOUNTER — Other Ambulatory Visit

## 2023-11-22 ENCOUNTER — Ambulatory Visit: Admitting: Physician Assistant

## 2023-11-22 ENCOUNTER — Encounter: Payer: Self-pay | Admitting: Physician Assistant

## 2023-11-22 VITALS — BP 127/68 | HR 65 | Ht 70.0 in | Wt 198.6 lb

## 2023-11-22 DIAGNOSIS — G4700 Insomnia, unspecified: Secondary | ICD-10-CM | POA: Diagnosis not present

## 2023-11-22 DIAGNOSIS — R079 Chest pain, unspecified: Secondary | ICD-10-CM

## 2023-11-22 DIAGNOSIS — R002 Palpitations: Secondary | ICD-10-CM | POA: Diagnosis not present

## 2023-11-22 MED ORDER — TRAZODONE HCL 50 MG PO TABS: 25.0000 mg | ORAL_TABLET | Freq: Every evening | ORAL | 3 refills | Status: AC | PRN

## 2023-11-22 NOTE — Progress Notes (Signed)
 New patient visit   Patient: Jose Humphrey   DOB: 05-Oct-2000   22 y.o. Male  MRN: [id number] Visit Date: 11/22/2023  Today's healthcare provider: Trenton Frock, PA-C   Cc. New pt, establish care   Subjective    Jose Humphrey is a 23 y.o. male who presents today as a new patient to establish care.   Discussed the use of AI scribe software for clinical note transcription with the patient, who gave verbal consent to proceed.  History of Present Illness   The patient, with a history of asthma, presents with a chief complaint of chest pain that started a week ago. The pain was located in the chest and was severe enough to take the patient's breath away. The patient denies any associated symptoms such as nausea, sweating, or vision changes. The pain was not relieved by Tums or Tylenol , and the patient does not believe it was related to acid reflux.  He went to urgent care 4/14, was told it was acid reflux and that his blood pressure was elevated.  The patient also reports occasional heart palpitations, described as arrhythmias that last for a few seconds and then resolve.   The patient also reports chronic insomnia, with periods of one to two days without sleep.       Past Medical History:  Diagnosis Date   Asthma    GERD (gastroesophageal reflux disease)    Headache    Past Surgical History:  Procedure Laterality Date   CIRCUMCISION  2000   COSMETIC SURGERY  05/2006   FACIAL COSMETIC SURGERY  05/30/2006   [name]   MOUTH SURGERY  05/30/2006   [name]   Family Status  Relation Name Status   Mother [name] Alive   Father [name] Alive   Sister  Alive       Younger   Brother  Alive       Younger   [name] Deceased   MGF  Alive   [name] Alive   PGF  Deceased   Sister  Alive       Younger   [name] Alive  No partnership data on file   Family History  Problem Relation Age of Onset   Anxiety disorder Mother    Hypertension  Mother    Obesity Mother    Varicose Veins Mother    Hypertension Father    Obesity Father    Varicose Veins Father    Heart Problems Maternal Grandmother        Died at 33   Early death Maternal Grandmother    Cancer Paternal Grandmother    Diabetes Paternal Grandmother    Heart Problems Paternal Grandfather        Died at 66   Diabetes Paternal Uncle    Social History   Socioeconomic History   Marital status: Single    Spouse name: Not on file   Number of children: Not on file   Years of education: Not on file   Highest education level: Bachelor's degree (e.g., BA, AB, BS)  Occupational History   Not on file  Tobacco Use   Smoking status: Never   Smokeless tobacco: Never  Vaping Use   Vaping status: Never Used  Substance and Sexual Activity   Alcohol use: Not Currently    Comment: I only drink on occasions, but not recently   Drug use: No   Sexual activity: Yes    Birth control/protection: Condom  Other  Topics Concern   Not on file  Social History Narrative   Not on file   Social Drivers of Health   Financial Resource Strain: Low Risk  (11/18/2023)   Overall Financial Resource Strain (CARDIA)    Difficulty of Paying Living Expenses: Not very hard  Food Insecurity: No Food Insecurity (11/18/2023)   Hunger Vital Sign    Worried About Running Out of Food in the Last Year: Never true    Ran Out of Food in the Last Year: Never true  Transportation Needs: No Transportation Needs (11/18/2023)   PRAPARE - Administrator, Civil Service (Medical): No    Lack of Transportation (Non-Medical): No  Physical Activity: Sufficiently Active (11/18/2023)   Exercise Vital Sign    Days of Exercise per Week: 2 days    Minutes of Exercise per Session: 120 min  Stress: Stress Concern Present (11/18/2023)   [name]    Feeling of Stress : Rather much  Social Connections: Moderately Isolated (11/18/2023)    Social Connection and Isolation Panel [NHANES]    Frequency of Communication with Friends and Family: More than three times a week    Frequency of Social Gatherings with Friends and Family: More than three times a week    Attends Religious Services: 1 to 4 times per year    Active Member of [name] or Organizations: No    Attends Engineer, structural: Not on file    Marital Status: Never married   Outpatient Medications Prior to Visit  Medication Sig   albuterol (PROVENTIL HFA;VENTOLIN HFA) 108 (90 BASE) MCG/ACT inhaler Inhale 2 puffs into the lungs every 6 (six) hours as needed for wheezing.   [DISCONTINUED] ciprofloxacin  (CIPRO ) 500 MG tablet Take 1 tablet (500 mg total) by mouth every 12 (twelve) hours.   [DISCONTINUED] promethazine (PHENERGAN) 25 MG tablet    No facility-administered medications prior to visit.   No Known Allergies  Immunization History  Administered Date(s) Administered   Influenza, Seasonal, Injecte, Preservative Fre 05/28/2023   Tdap 12/02/2021    Health Maintenance  Topic Date Due   HPV VACCINES (1 - Male 3-dose series) Never done   HIV Screening  Never done   Hepatitis C Screening  Never done   Pneumococcal Vaccine 59-6 Years old (1 of 2 - PCV) Never done   COVID-19 Vaccine (1 - 2024-25 season) Never done   INFLUENZA VACCINE  02/28/2024   DTaP/Tdap/Td (2 - Td or Tdap) 12/03/2031   Meningococcal B Vaccine  Aged Out    Patient Care Team: Trenton Frock, PA-C as PCP - General (Physician Assistant)  Review of Systems  Constitutional:  Negative for fatigue and fever.  Respiratory:  Negative for cough and shortness of breath.   Cardiovascular:  Positive for chest pain and palpitations. Negative for leg swelling.  Neurological:  Negative for dizziness and headaches.  Psychiatric/Behavioral:  Positive for sleep disturbance.         Objective    BP 127/68   Pulse 65   Ht 5\' 10"  (1.778 m)   Wt 198 lb 9.6 oz (90.1 kg)   BMI 28.50 kg/m      Physical Exam Constitutional:      General: He is awake.     Appearance: He is well-developed.  HENT:     Head: Normocephalic.  Eyes:     Conjunctiva/sclera: Conjunctivae normal.  Cardiovascular:     Rate and Rhythm: Normal rate and regular  rhythm.     Heart sounds: Normal heart sounds.  Pulmonary:     Effort: Pulmonary effort is normal.     Breath sounds: Normal breath sounds.  Musculoskeletal:     Right lower leg: No edema.     Left lower leg: No edema.  Skin:    General: Skin is warm.  Neurological:     Mental Status: He is alert and oriented to person, place, and time.  Psychiatric:        Attention and Perception: Attention normal.        Mood and Affect: Mood normal.        Speech: Speech normal.        Behavior: Behavior is cooperative.     Depression Screen    11/22/2023    3:51 PM  PHQ 2/9 Scores  PHQ - 2 Score 2   No results found for any visits on 11/22/23.  Assessment & Plan     Palpitations Chest pain, unspecified type - Order labs for anemia, electrolyte imbalances, and thyroid function. - Advise taking acetaminophen  if chest pain recurs and monitor for changes. - Instruct to seek emergency care if chest pain increases in severity or is accompanied by dizziness, vision changes, sweating, or nausea.  Refer to cardiology pending labs.  -     TSH -     Lipid panel -     Hemoglobin A1c -     Comprehensive metabolic panel with GFR -     CBC with Differential/Platelet  Insomnia, unspecified type Trial of trazodone, 25- 50 mg prn qhs  -     traZODone HCl; Take 0.5-1 tablets (25-50 mg total) by mouth at bedtime as needed for sleep.  Dispense: 30 tablet; Refill: 3 Assessment and Plan      Return f/u pending labs.      Trenton Frock, PA-C  [facility] [phone] (phone) [phone] (fax)  [facility]

## 2023-11-25 ENCOUNTER — Other Ambulatory Visit: Payer: Self-pay | Admitting: Physician Assistant

## 2023-11-25 ENCOUNTER — Encounter: Payer: Self-pay | Admitting: Physician Assistant

## 2023-11-25 DIAGNOSIS — R079 Chest pain, unspecified: Secondary | ICD-10-CM

## 2023-11-25 DIAGNOSIS — R002 Palpitations: Secondary | ICD-10-CM

## 2023-11-25 LAB — CBC WITH DIFFERENTIAL/PLATELET
Basophils Absolute: 0.1 10*3/uL (ref 0.0–0.2)
Basos: 1 %
EOS (ABSOLUTE): 0.1 10*3/uL (ref 0.0–0.4)
Eos: 2 %
Hematocrit: 44.1 % (ref 37.5–51.0)
Hemoglobin: 14.1 g/dL (ref 13.0–17.7)
Immature Grans (Abs): 0 10*3/uL (ref 0.0–0.1)
Immature Granulocytes: 0 %
Lymphocytes Absolute: 2.6 10*3/uL (ref 0.7–3.1)
Lymphs: 35 %
MCH: 28.2 pg (ref 26.6–33.0)
MCHC: 32 g/dL (ref 31.5–35.7)
MCV: 88 fL (ref 79–97)
Monocytes Absolute: 0.4 10*3/uL (ref 0.1–0.9)
Monocytes: 5 %
Neutrophils Absolute: 4.2 10*3/uL (ref 1.4–7.0)
Neutrophils: 57 %
Platelets: 204 10*3/uL (ref 150–450)
RBC: 5 x10E6/uL (ref 4.14–5.80)
RDW: 13.2 % (ref 11.6–15.4)
WBC: 7.4 10*3/uL (ref 3.4–10.8)

## 2023-11-25 LAB — LIPID PANEL
Chol/HDL Ratio: 3 ratio (ref 0.0–5.0)
Cholesterol, Total: 169 mg/dL (ref 100–199)
HDL: 56 mg/dL (ref >39)
LDL Chol Calc (NIH): 104 mg/dL — ABNORMAL HIGH (ref 0–99)
Triglycerides: 41 mg/dL (ref 0–149)
VLDL Cholesterol Cal: 9 mg/dL (ref 5–40)

## 2023-11-25 LAB — COMPREHENSIVE METABOLIC PANEL WITH GFR
ALT: 9 IU/L (ref 0–44)
AST: 10 IU/L (ref 0–40)
Albumin: 4.9 g/dL (ref 4.3–5.2)
Alkaline Phosphatase: 54 IU/L (ref 44–121)
BUN/Creatinine Ratio: 18 (ref 9–20)
BUN: 16 mg/dL (ref 6–20)
Bilirubin Total: 0.5 mg/dL (ref 0.0–1.2)
CO2: 24 mmol/L (ref 20–29)
Calcium: 9.8 mg/dL (ref 8.7–10.2)
Chloride: 104 mmol/L (ref 96–106)
Creatinine, Ser: 0.9 mg/dL (ref 0.76–1.27)
Globulin, Total: 2 g/dL (ref 1.5–4.5)
Glucose: 87 mg/dL (ref 70–99)
Potassium: 4.2 mmol/L (ref 3.5–5.2)
Sodium: 143 mmol/L (ref 134–144)
Total Protein: 6.9 g/dL (ref 6.0–8.5)
eGFR: 122 mL/min/{1.73_m2} (ref >59)

## 2023-11-25 LAB — HEMOGLOBIN A1C
Est. average glucose Bld gHb Est-mCnc: 94 mg/dL
Hgb A1c MFr Bld: 4.9 % (ref 4.8–5.6)

## 2023-11-25 LAB — TSH: TSH: 1.27 u[IU]/mL (ref 0.450–4.500)

## 2023-11-26 ENCOUNTER — Ambulatory Visit: Admitting: Physician Assistant

## 2023-12-25 ENCOUNTER — Encounter: Payer: Self-pay | Admitting: Physician Assistant

## 2023-12-27 ENCOUNTER — Other Ambulatory Visit: Payer: Self-pay | Admitting: Physician Assistant

## 2023-12-27 DIAGNOSIS — G4700 Insomnia, unspecified: Secondary | ICD-10-CM

## 2023-12-27 MED ORDER — HYDROXYZINE PAMOATE 25 MG PO CAPS: 25.0000 mg | ORAL_CAPSULE | Freq: Every evening | ORAL | 1 refills | Status: DC | PRN

## 2024-01-21 ENCOUNTER — Ambulatory Visit: Admitting: Cardiology

## 2024-02-11 ENCOUNTER — Other Ambulatory Visit: Payer: Self-pay | Admitting: Physician Assistant

## 2024-02-11 DIAGNOSIS — G4700 Insomnia, unspecified: Secondary | ICD-10-CM

## 2024-02-13 ENCOUNTER — Encounter: Payer: Self-pay | Admitting: Cardiology

## 2024-02-13 ENCOUNTER — Ambulatory Visit: Attending: Cardiology | Admitting: Cardiology

## 2024-02-13 ENCOUNTER — Ambulatory Visit: Attending: Cardiology

## 2024-02-13 ENCOUNTER — Telehealth (HOSPITAL_COMMUNITY): Payer: Self-pay | Admitting: *Deleted

## 2024-02-13 VITALS — BP 116/70 | HR 75 | Ht 70.0 in | Wt 209.0 lb

## 2024-02-13 DIAGNOSIS — R0789 Other chest pain: Secondary | ICD-10-CM

## 2024-02-13 DIAGNOSIS — R079 Chest pain, unspecified: Secondary | ICD-10-CM | POA: Diagnosis not present

## 2024-02-13 DIAGNOSIS — R002 Palpitations: Secondary | ICD-10-CM | POA: Diagnosis not present

## 2024-02-13 DIAGNOSIS — J4520 Mild intermittent asthma, uncomplicated: Secondary | ICD-10-CM

## 2024-02-13 NOTE — Patient Instructions (Signed)
 Medication Instructions:  Your physician recommends that you continue on your current medications as directed. Please refer to the Current Medication list given to you today.  *If you need a refill on your cardiac medications before your next appointment, please call your pharmacy*   Lab Work: None Ordered If you have labs (blood work) drawn today and your tests are completely normal, you will receive your results only by: MyChart Message (if you have MyChart) OR A paper copy in the mail If you have any lab test that is abnormal or we need to change your treatment, we will call you to review the results.   Testing/Procedures: Your physician has requested that you have an exercise tolerance test.  Eat a light breakfast  Dress to Exercise   WHY IS MY DOCTOR PRESCRIBING ZIO? The Zio system is proven and trusted by physicians to detect and diagnose irregular heart rhythms -- and has been prescribed to hundreds of thousands of patients.  The FDA has cleared the Zio system to monitor for many different kinds of irregular heart rhythms. In a study, physicians were able to reach a diagnosis 90% of the time with the Zio system1.  You can wear the Zio monitor -- a small, discreet, comfortable patch -- during your normal day-to-day activity, including while you sleep, shower, and exercise, while it records every single heartbeat for analysis.  1Barrett, P., et al. Comparison of 24 Hour Holter Monitoring Versus 14 Day Novel Adhesive Patch Electrocardiographic Monitoring. American Journal of Medicine, 2014.  ZIO VS. HOLTER MONITORING The Zio monitor can be comfortably worn for up to 14 days. Holter monitors can be worn for 24 to 48 hours, limiting the time to record any irregular heart rhythms you may have. Zio is able to capture data for the 51% of patients who have their first symptom-triggered arrhythmia after 48 hours.1  LIVE WITHOUT RESTRICTIONS The Zio ambulatory cardiac monitor is a  small, unobtrusive, and water-resistant patch--you might even forget you're wearing it. The Zio monitor records and stores every beat of your heart, whether you're sleeping, working out, or showering.     Follow-Up: At [facility], you and your health needs are our priority.  As part of our continuing mission to provide you with exceptional heart care, we have created designated Provider Care Teams.  These Care Teams include your primary Cardiologist (physician) and Advanced Practice Providers (APPs -  Physician Assistants and Nurse Practitioners) who all work together to provide you with the care you need, when you need it.  We recommend signing up for the patient portal called MyChart.  Sign up information is provided on this After Visit Summary.  MyChart is used to connect with patients for Virtual Visits (Telemedicine).  Patients are able to view lab/test results, encounter notes, upcoming appointments, etc.  Non-urgent messages can be sent to your provider as well.   To learn more about what you can do with MyChart, go to [email or web address].    Your next appointment:   3 month(s)  The format for your next appointment:   In Person  Provider:   Lamar Fitch, MD    Other Instructions NA

## 2024-02-13 NOTE — Telephone Encounter (Signed)
 Spoke to patient as a reminder about his ETT on 02/20/24 at 10:30.

## 2024-02-13 NOTE — Progress Notes (Signed)
 Cardiology Consultation:    Date:  02/13/2024   ID:  Jose Humphrey, DOB July 16, 2001, MRN [id number]  PCP:  Cyndi Shaver, PA-C  Cardiologist:  Lamar Fitch, MD   Referring MD: Cyndi Shaver, PA-C     History of Present Illness:    Jose Humphrey is a 23 y.o. male who is being seen today for the evaluation of chest pain palpitations at the request of Cyndi Shaver, PA-C.  Past medical history significant for asthma he uses a nebulizer but very rarely, he was referred to us  because of episode of chest pain.  He said that happens for last few years usually when he wakes up he will develop this tightness in the chest lasting for up to 1 hour maybe a little shortness of breath but not much swelling.  He simply get ready for work and school and then all symptoms goes away interestingly at the same time he is able to exercise lift weights and have no difficulty doing it.  Described also to have palpitations that would mean by that is his heart speeding up that happened a few times a week making her uncomfortable but no passing out but he feels dizzy sometimes with it.  He does not smoke does not exercise on the regular basis not on any special diet and overall is healthy does not have high blood pressure diabetes he is cholesterol LDL 104 which was checked in April of this year.  Does not have family history of premature coronary artery disease  Past Medical History:  Diagnosis Date   Asthma    GERD (gastroesophageal reflux disease)    Headache     Past Surgical History:  Procedure Laterality Date   CIRCUMCISION  2000   COSMETIC SURGERY  05/2006   FACIAL COSMETIC SURGERY  05/30/2006   [facility]   MOUTH SURGERY  05/30/2006   [facility]    Current Medications: Current Meds  Medication Sig   albuterol (PROVENTIL HFA;VENTOLIN HFA) 108 (90 BASE) MCG/ACT inhaler Inhale 2 puffs into the lungs every 6 (six) hours as needed for wheezing.   hydrOXYzine   (VISTARIL ) 25 MG capsule Take 1 capsule (25 mg total) by mouth at bedtime as needed.   traZODone  (DESYREL ) 50 MG tablet Take 0.5-1 tablets (25-50 mg total) by mouth at bedtime as needed for sleep.     Allergies:   Patient has no known allergies.   Social History   Socioeconomic History   Marital status: Single    Spouse name: Not on file   Number of children: Not on file   Years of education: Not on file   Highest education level: Bachelor's degree (e.g., BA, AB, BS)  Occupational History   Not on file  Tobacco Use   Smoking status: Never   Smokeless tobacco: Never  Vaping Use   Vaping status: Never Used  Substance and Sexual Activity   Alcohol use: Not Currently    Comment: I only drink on occasions, but not recently   Drug use: No   Sexual activity: Yes    Birth control/protection: Condom  Other Topics Concern   Not on file  Social History Narrative   Not on file   Social Drivers of Health   Financial Resource Strain: Low Risk  (11/18/2023)   Overall Financial Resource Strain (CARDIA)    Difficulty of Paying Living Expenses: Not very hard  Food Insecurity: No Food Insecurity (11/18/2023)   Hunger Vital Sign    Worried About Running  Out of Food in the Last Year: Never true    Ran Out of Food in the Last Year: Never true  Transportation Needs: No Transportation Needs (11/18/2023)   PRAPARE - Administrator, Civil Service (Medical): No    Lack of Transportation (Non-Medical): No  Physical Activity: Sufficiently Active (11/18/2023)   Exercise Vital Sign    Days of Exercise per Week: 2 days    Minutes of Exercise per Session: 120 min  Stress: Stress Concern Present (11/18/2023)   [name]    Feeling of Stress : Rather much  Social Connections: Moderately Isolated (11/18/2023)   Social Connection and Isolation Panel    Frequency of Communication with Friends and Family: More than three times a  week    Frequency of Social Gatherings with Friends and Family: More than three times a week    Attends Religious Services: 1 to 4 times per year    Active Member of [name] or Organizations: No    Attends Engineer, structural: Not on file    Marital Status: Never married     Family History: The patient's family history includes Anxiety disorder in his mother; Cancer in his paternal grandmother; Diabetes in his paternal grandmother and paternal uncle; Early death in his maternal grandmother; Heart Problems in his maternal grandmother and paternal grandfather; Hypertension in his father and mother; Obesity in his father and mother; Varicose Veins in his father and mother. ROS:   Please see the history of present illness.    All 14 point review of systems negative except as described per history of present illness.  EKGs/Labs/Other Studies Reviewed:    The following studies were reviewed today:   EKG:  EKG Interpretation Date/Time:  Thursday February 13 2024 08:32:29 EDT Ventricular Rate:  79 PR Interval:  172 QRS Duration:  74 QT Interval:  352 QTC Calculation: 403 R Axis:   21  Text Interpretation: Normal sinus rhythm Normal ECG When compared with ECG of 11-Nov-2023 18:41, No significant change was found Confirmed by Bernie Charleston [phone]) on 02/13/2024 8:35:00 AM    Recent Labs: 11/22/2023: ALT 9; BUN 16; Creatinine, Ser 0.90; Hemoglobin 14.1; Platelets 204; Potassium 4.2; Sodium 143; TSH 1.270  Recent Lipid Panel    Component Value Date/Time   CHOL 169 11/22/2023 1614   TRIG 41 11/22/2023 1614   HDL 56 11/22/2023 1614   CHOLHDL 3.0 11/22/2023 1614   LDLCALC 104 (H) 11/22/2023 1614    Physical Exam:    VS:  BP 116/70 (BP Location: Right Arm, Patient Position: Sitting)   Pulse 75   Ht 5' 10 (1.778 m)   Wt 209 lb (94.8 kg)   SpO2 97%   BMI 29.99 kg/m     Wt Readings from Last 3 Encounters:  02/13/24 209 lb (94.8 kg)  11/22/23 198 lb 9.6 oz (90.1 kg)   11/11/23 195 lb (88.5 kg)     GEN:  Well nourished, well developed in no acute distress HEENT: Normal NECK: No JVD; No carotid bruits LYMPHATICS: No lymphadenopathy CARDIAC: RRR, no murmurs, no rubs, no gallops RESPIRATORY:  Clear to auscultation without rales, wheezing or rhonchi  ABDOMEN: Soft, non-tender, non-distended MUSCULOSKELETAL:  No edema; No deformity  SKIN: Warm and dry NEUROLOGIC:  Alert and oriented x 3 PSYCHIATRIC:  Normal affect   ASSESSMENT:    1. Chest pain, unspecified type   2. Atypical chest pain   3. Palpitations   4.  Mild intermittent asthma without complication    PLAN:    In order of problems listed above:  Chest pain with very atypical characteristic I told him and explained him to have very low level suspicion of this coronary disease, for completion I will ask him to have exercise stress test done to make sure he does not have any ischemia even though I have low level suspicion. Palpitations: Will do Zio patch on him to see if he get any significant arrhythmia. Asthma stable very rarely uses MDI. Cholesterol LDL 104 HDL 56 continue present management   Medication Adjustments/Labs and Tests Ordered: Current medicines are reviewed at length with the patient today.  Concerns regarding medicines are outlined above.  Orders Placed This Encounter  Procedures   EKG 12-Lead   No orders of the defined types were placed in this encounter.   Signed, Lamar DOROTHA Fitch, MD, [facility]. 02/13/2024 8:53 AM    [facility]

## 2024-02-20 ENCOUNTER — Ambulatory Visit (HOSPITAL_COMMUNITY)
Admission: RE | Admit: 2024-02-20 | Discharge: 2024-02-20 | Disposition: A | Discharge status: Home / Self Care | Source: Ambulatory Visit | Attending: Cardiology | Admitting: Cardiology

## 2024-02-20 DIAGNOSIS — R079 Chest pain, unspecified: Secondary | ICD-10-CM | POA: Insufficient documentation

## 2024-02-20 LAB — EXERCISE TOLERANCE TEST
Angina Index: 0
Duke Treadmill Score: 15
Estimated workload: 17.5
Exercise duration (min): 15 min
Exercise duration (sec): 0 s
MPHR: 195 {beats}/min
Peak HR: 190 {beats}/min
Percent HR: 97 %
Rest HR: 72 {beats}/min
ST Depression (mm): 0 mm

## 2024-02-24 ENCOUNTER — Encounter (HOSPITAL_BASED_OUTPATIENT_CLINIC_OR_DEPARTMENT_OTHER): Payer: Self-pay | Admitting: Adult Health

## 2024-02-24 ENCOUNTER — Ambulatory Visit (HOSPITAL_BASED_OUTPATIENT_CLINIC_OR_DEPARTMENT_OTHER): Admitting: Adult Health

## 2024-02-24 VITALS — BP 117/70 | HR 57 | Ht 70.0 in | Wt 213.4 lb

## 2024-02-24 DIAGNOSIS — G4700 Insomnia, unspecified: Secondary | ICD-10-CM

## 2024-02-24 DIAGNOSIS — R400 Somnolence: Secondary | ICD-10-CM | POA: Diagnosis not present

## 2024-02-24 NOTE — Patient Instructions (Signed)
 Set up for Home sleep study.  Work on healthy sleep regimen  Do not drive if sleepy.  Activity and exercise daily as able  Follow up in 6 weeks to discuss results and treatment plan

## 2024-02-24 NOTE — Progress Notes (Signed)
 @Patient  ID: Jose Humphrey, male    DOB: 2001/02/16, 23 y.o.   MRN: [id number]  Chief Complaint  Patient presents with   Consult    Sleep consult     Referring provider: Cyndi Shaver, DEVONNA  HPI: 23 yo male presents for sleep consult 02/24/24 for daytime sleepiness and chronic insomnia   TEST/EVENTS :   02/24/2024 Sleep consult  Discussed the use of AI scribe software for clinical note transcription with the patient, who gave verbal consent to proceed.  History of Present Illness Jose Humphrey is a 23 year old male who presents for sleep consult with chronic insomnia and daytime sleepiness   He has a lifelong history of poor sleep, which has progressively worsened. As a child, he stayed up late, but now he experiences difficulty both falling asleep and maintaining sleep. His sleep pattern is erratic, with periods of adequate sleep followed by several days of insomnia. He typically goes to bed between 9 and 11 PM, but it takes him until midnight or 1 AM to fall asleep. He wakes up at 7:45 AM, often feeling unrested unless he wakes up in the middle of the night to use the bathroom. He has tried trazodone  for sleep without success and is currently using hydroxyzine , which initially helps but loses effectiveness with consecutive use. He is attempting to wean off hydroxyzine . He does not use caffeine regularly and does not take daytime naps. He experiences mild symptoms of restless legs at night.   He has noticed recent palpitations, which he began to pay attention to after experiencing chest problems in April. He has undergone cardiac work up with stress test and is currently wearing a monitor, though results are not yet available.  His family history includes a father who likely has sleep apnea due to snoring. He has lost 70 pounds after previously gaining weight due to lifestyle factors.  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 He does not smoke, uses alcohol socially, and denies drug use. He is single with no children. He engages in weightlifting twice a week and occasionally does cardio exercises.     02/24/2024    8:00 AM  Results of the Epworth flowsheet  Sitting and reading 0  Watching TV 1  Sitting, inactive in a public place (e.g. a theatre or a meeting) 3  As a passenger in a car for an hour without a break 3  Lying down to rest in the afternoon when circumstances permit 2  Sitting and talking to someone 0  Sitting quietly after a lunch without alcohol 1  In a car, while stopped for a few minutes in traffic 0  Total score 10     Past Surgical History:  Procedure Laterality Date   CIRCUMCISION  2000   COSMETIC SURGERY  05/2006   FACIAL COSMETIC SURGERY  05/30/2006   [facility]   MOUTH SURGERY  05/30/2006   [facility]       No Known Allergies  Immunization History  Administered Date(s) Administered   Influenza, Seasonal, Injecte, Preservative Fre 05/28/2023   Tdap 12/02/2021    Past Medical History:  Diagnosis Date   Asthma    GERD (gastroesophageal reflux disease)    Headache     Tobacco History: Social History   Tobacco Use  Smoking Status Never   Passive exposure: Past  Smokeless Tobacco Never   Counseling given: Not Answered   Outpatient Medications Prior  to Visit  Medication Sig Dispense Refill   albuterol (PROVENTIL HFA;VENTOLIN HFA) 108 (90 BASE) MCG/ACT inhaler Inhale 2 puffs into the lungs every 6 (six) hours as needed for wheezing.     hydrOXYzine  (VISTARIL ) 25 MG capsule Take 1 capsule (25 mg total) by mouth at bedtime as needed. 30 capsule 1   traZODone  (DESYREL ) 50 MG tablet Take 0.5-1 tablets (25-50 mg total) by mouth at bedtime as needed for sleep. 30 tablet 3   No facility-administered medications prior to visit.     Review of Systems:   Constitutional:   No  weight loss, night sweats,  Fevers, chills, fatigue, or   lassitude.  HEENT:   No headaches,  Difficulty swallowing,  Tooth/dental problems, or  Sore throat,                No sneezing, itching, ear ache, nasal congestion, post nasal drip,   CV:  No chest pain,  Orthopnea, PND, swelling in lower extremities, anasarca, dizziness, palpitations, syncope.   GI  No heartburn, indigestion, abdominal pain, nausea, vomiting, diarrhea, change in bowel habits, loss of appetite, bloody stools.   Resp:   No wheezing.  No chest wall deformity  Skin: no rash or lesions.  GU: no dysuria, change in color of urine, no urgency or frequency.  No flank pain, no hematuria   MS:  No joint pain or swelling.  No decreased range of motion.  No back pain.    Physical Exam  BP 117/70 (BP Location: Left Arm, Patient Position: Sitting, Cuff Size: Large)   Pulse (!) 57   Ht 5' 10 (1.778 m)   Wt 213 lb 6.4 oz (96.8 kg)   SpO2 98%   BMI 30.62 kg/m   GEN: A/Ox3; pleasant , NAD, well nourished    HEENT:  [place]/AT,  NOSE-clear, THROAT-clear, no lesions, no postnasal drip or exudate noted. Class 3 MP airway   NECK:  Supple w/ fair ROM; no JVD; normal carotid impulses w/o bruits; no thyromegaly or nodules palpated; no lymphadenopathy.    RESP  Clear  P & A; w/o, wheezes/ rales/ or rhonchi. no accessory muscle use, no dullness to percussion  CARD:  RRR, no m/r/g, no peripheral edema, pulses intact, no cyanosis or clubbing.  GI:   Soft & nt; nml bowel sounds; no organomegaly or masses detected.   Musco: Warm bil, no deformities or joint swelling noted.   Neuro: alert, no focal deficits noted.    Skin: Warm, no lesions or rashes    Lab Results:  CBC    Component Value Date/Time   WBC 7.4 11/22/2023 1614   WBC 12.4 (H) 08/22/2018 1715   RBC 5.00 11/22/2023 1614   RBC 5.10 08/22/2018 1715   HGB 14.1 11/22/2023 1614   HCT 44.1 11/22/2023 1614   PLT 204 11/22/2023 1614   MCV 88 11/22/2023 1614   MCH 28.2 11/22/2023 1614   MCH 29.2 08/22/2018 1715   MCHC  32.0 11/22/2023 1614   MCHC 35.0 08/22/2018 1715   RDW 13.2 11/22/2023 1614   LYMPHSABS 2.6 11/22/2023 1614   EOSABS 0.1 11/22/2023 1614   BASOSABS 0.1 11/22/2023 1614    BMET    Component Value Date/Time   NA 143 11/22/2023 1614   K 4.2 11/22/2023 1614   CL 104 11/22/2023 1614   CO2 24 11/22/2023 1614   GLUCOSE 87 11/22/2023 1614   GLUCOSE 114 (H) 08/22/2018 1715   BUN 16 11/22/2023 1614   CREATININE 0.90 11/22/2023  1614   CALCIUM 9.8 11/22/2023 1614   GFRNONAA >60 08/22/2018 1715   GFRAA >60 08/22/2018 1715    BNP No results found for: BNP  ProBNP No results found for: PROBNP  Imaging: EXERCISE TOLERANCE TEST (ETT) Result Date: 02/20/2024   No ST deviation was noted.   Prior study not available for comparison. Negative stress test for ischemia.  Excellent functional capacity.  Low risk test.    Administration History     None           No data to display          No results found for: NITRICOXIDE      Assessment & Plan:    Assessment and Plan Assessment & Plan Insomnia   Chronic insomnia presents with difficulty initiating and maintaining sleep, characterized by fragmented sleep and waking unrefreshed. He occasionally uses hydroxyzine  and trazodone . Sleep apnea is considered a differential diagnosis and may contribute to insomnia. The impact of screen time, exercise, and sleep hygiene on sleep quality was discussed. Sleep aids like hydroxyzine  and trazodone  may lose effectiveness and cause residual daytime tiredness. Potential risks of sleep apnea, including cardiovascular complications and metabolic disturbances, were explained. A home sleep study is ordered to evaluate for sleep apnea. Sleep hygiene improvements, including a consistent sleep schedule and exercise, were discussed. Short-term use of sleep aids will be considered if sleep apnea is ruled out.  Daytime sleepiness , restless sleep- concerning for sleep apnea.  - discussed how weight  can impact sleep and risk for sleep disordered breathing - discussed options to assist with weight loss: combination of diet modification, cardiovascular and strength training exercises   - had an extensive discussion regarding the adverse health consequences related to untreated sleep disordered breathing - specifically discussed the risks for hypertension, coronary artery disease, cardiac dysrhythmias, cerebrovascular disease, and diabetes - lifestyle modification discussed   - discussed how sleep disruption can increase risk of accidents, particularly when driving - safe driving practices were discussed   -set up for home sleep study.   Palpitations   Recent onset of palpitations may be related to sleep disturbances or stress. There is no history of stroke or congestive heart failure. Continue with cardiology workup.      Madelin Stank, NP 02/24/2024

## 2024-02-24 NOTE — Addendum Note (Signed)
 Addended by: ORLIE MADELIN RAMAN on: 02/24/2024 02:03 PM   Modules accepted: Orders

## 2024-02-27 ENCOUNTER — Ambulatory Visit: Payer: Self-pay | Admitting: Cardiology

## 2024-02-27 ENCOUNTER — Telehealth: Payer: Self-pay

## 2024-02-27 NOTE — Telephone Encounter (Signed)
 Pt viewed stress test results on My Chart per Dr. Vanetta Shawl note. Routed to PCP.

## 2024-02-29 ENCOUNTER — Other Ambulatory Visit: Payer: Self-pay | Admitting: Physician Assistant

## 2024-02-29 DIAGNOSIS — G4700 Insomnia, unspecified: Secondary | ICD-10-CM

## 2024-03-06 ENCOUNTER — Ambulatory Visit (HOSPITAL_BASED_OUTPATIENT_CLINIC_OR_DEPARTMENT_OTHER): Admitting: Adult Health

## 2024-03-19 DIAGNOSIS — R002 Palpitations: Secondary | ICD-10-CM | POA: Diagnosis not present

## 2024-04-05 DIAGNOSIS — R002 Palpitations: Secondary | ICD-10-CM | POA: Diagnosis not present

## 2024-04-08 ENCOUNTER — Telehealth: Payer: Self-pay

## 2024-04-08 NOTE — Telephone Encounter (Signed)
 Left message on My Chart with monitor results per Dr. Karry note. Routed to PCP

## 2024-04-10 ENCOUNTER — Ambulatory Visit: Admitting: Adult Health

## 2024-05-08 ENCOUNTER — Other Ambulatory Visit: Payer: Self-pay | Admitting: Medical Genetics

## 2024-05-08 DIAGNOSIS — Z006 Encounter for examination for normal comparison and control in clinical research program: Secondary | ICD-10-CM

## 2024-05-20 ENCOUNTER — Ambulatory Visit: Admitting: Cardiology

## 2024-06-13 ENCOUNTER — Other Ambulatory Visit: Payer: Self-pay

## 2024-06-13 ENCOUNTER — Emergency Department (HOSPITAL_BASED_OUTPATIENT_CLINIC_OR_DEPARTMENT_OTHER)
Admission: EM | Admit: 2024-06-13 | Discharge: 2024-06-14 | Disposition: A | Discharge status: Home / Self Care | Attending: Emergency Medicine | Admitting: Emergency Medicine

## 2024-06-13 ENCOUNTER — Emergency Department (HOSPITAL_BASED_OUTPATIENT_CLINIC_OR_DEPARTMENT_OTHER)

## 2024-06-13 ENCOUNTER — Encounter (HOSPITAL_BASED_OUTPATIENT_CLINIC_OR_DEPARTMENT_OTHER): Payer: Self-pay

## 2024-06-13 DIAGNOSIS — J45909 Unspecified asthma, uncomplicated: Secondary | ICD-10-CM | POA: Diagnosis not present

## 2024-06-13 DIAGNOSIS — K047 Periapical abscess without sinus: Secondary | ICD-10-CM | POA: Insufficient documentation

## 2024-06-13 DIAGNOSIS — K0889 Other specified disorders of teeth and supporting structures: Secondary | ICD-10-CM | POA: Diagnosis present

## 2024-06-13 DIAGNOSIS — Z0389 Encounter for observation for other suspected diseases and conditions ruled out: Secondary | ICD-10-CM | POA: Diagnosis not present

## 2024-06-13 LAB — CBC WITH DIFFERENTIAL/PLATELET
Abs Immature Granulocytes: 0.02 K/uL (ref 0.00–0.07)
Basophils Absolute: 0.1 K/uL (ref 0.0–0.1)
Basophils Relative: 1 %
Eosinophils Absolute: 0.3 K/uL (ref 0.0–0.5)
Eosinophils Relative: 3 %
HCT: 38.4 % — ABNORMAL LOW (ref 39.0–52.0)
Hemoglobin: 14.1 g/dL (ref 13.0–17.0)
Immature Granulocytes: 0 %
Lymphocytes Relative: 33 %
Lymphs Abs: 3 K/uL (ref 0.7–4.0)
MCH: 29.6 pg (ref 26.0–34.0)
MCHC: 36.7 g/dL — ABNORMAL HIGH (ref 30.0–36.0)
MCV: 80.7 fL (ref 80.0–100.0)
Monocytes Absolute: 0.5 K/uL (ref 0.1–1.0)
Monocytes Relative: 6 %
Neutro Abs: 5.1 K/uL (ref 1.7–7.7)
Neutrophils Relative %: 57 %
Platelets: 209 K/uL (ref 150–400)
RBC: 4.76 MIL/uL (ref 4.22–5.81)
RDW: 12.5 % (ref 11.5–15.5)
WBC: 9 K/uL (ref 4.0–10.5)
nRBC: 0 % (ref 0.0–0.2)

## 2024-06-13 LAB — COMPREHENSIVE METABOLIC PANEL WITH GFR
ALT: 9 U/L (ref 0–44)
AST: 15 U/L (ref 15–41)
Albumin: 4.5 g/dL (ref 3.5–5.0)
Alkaline Phosphatase: 60 U/L (ref 38–126)
Anion gap: 10 (ref 5–15)
BUN: 21 mg/dL — ABNORMAL HIGH (ref 6–20)
CO2: 28 mmol/L (ref 22–32)
Calcium: 10 mg/dL (ref 8.9–10.3)
Chloride: 104 mmol/L (ref 98–111)
Creatinine, Ser: 1 mg/dL (ref 0.61–1.24)
GFR, Estimated: >60 mL/min (ref >60)
Glucose, Bld: 70 mg/dL (ref 70–99)
Potassium: 4 mmol/L (ref 3.5–5.1)
Sodium: 142 mmol/L (ref 135–145)
Total Bilirubin: 0.4 mg/dL (ref 0.0–1.2)
Total Protein: 7.4 g/dL (ref 6.5–8.1)

## 2024-06-13 LAB — GROUP A STREP BY PCR: Group A Strep by PCR: NOT DETECTED

## 2024-06-13 MED ORDER — IOHEXOL 300 MG/ML  SOLN
75.0000 mL | Freq: Once | INTRAMUSCULAR | Status: AC | PRN
  Administered 2024-06-13: 75 mL via INTRAVENOUS

## 2024-06-13 NOTE — ED Provider Notes (Signed)
 [place] EMERGENCY DEPARTMENT AT [facility] Provider Note   CSN: [id number] Arrival date & time: 06/13/24  2021     Patient presents with: Dental Problem   Jose Humphrey is a 23 y.o. male.  Patient with past history significant for asthma, GERD presents to the emergency department with concerns of dental problem.  Reports that he noticed pain and swelling to the left hand of his the last 2 days that is worsening.  He denies any fever, chills, body aches, congestion, or sore throat.  He denies any history of similar symptoms.  He has not tried any medications for this area pain and swelling is endorsed.  HPI     Prior to Admission medications   Medication Sig Start Date End Date Taking? Authorizing Provider  amoxicillin-clavulanate (AUGMENTIN) 875-125 MG tablet Take 1 tablet by mouth every 12 (twelve) hours for 7 days. 06/14/24 06/21/24 Yes Brittanyann Wittner A, PA-C  albuterol (PROVENTIL HFA;VENTOLIN HFA) 108 (90 BASE) MCG/ACT inhaler Inhale 2 puffs into the lungs every 6 (six) hours as needed for wheezing.    [provider]  hydrOXYzine  (VISTARIL ) 25 MG capsule TAKE 1 CAPSULE (25 MG TOTAL) BY MOUTH AT BEDTIME AS NEEDED. 03/02/24   Cyndi, Manuelita, PA-C  traZODone  (DESYREL ) 50 MG tablet Take 0.5-1 tablets (25-50 mg total) by mouth at bedtime as needed for sleep. 11/22/23   Cyndi Manuelita, PA-C    Allergies: Patient has no known allergies.    Review of Systems  All other systems reviewed and are negative.   Updated Vital Signs BP 96/65   Pulse 73   Temp 98.8 F (37.1 C) (Oral)   Resp 18   SpO2 99%   Physical Exam Vitals and nursing note reviewed.  Constitutional:      General: He is not in acute distress.    Appearance: He is well-developed.  HENT:     Head: Normocephalic and atraumatic.     Mouth/Throat:     Tonsils: Tonsillar abscess present. 2+ on the right. 2+ on the left.      Comments: Swelling and erythema of the left tonsillar pillar with  some areas of thinning skin appearing to contain possible fluid. Tonsils are 2+ bilaterally. Trismus present. Eyes:     Conjunctiva/sclera: Conjunctivae normal.  Cardiovascular:     Rate and Rhythm: Normal rate and regular rhythm.     Heart sounds: No murmur heard. Pulmonary:     Effort: Pulmonary effort is normal. No respiratory distress.     Breath sounds: Normal breath sounds.  Abdominal:     Palpations: Abdomen is soft.     Tenderness: There is no abdominal tenderness.  Musculoskeletal:        General: No swelling.     Cervical back: Neck supple.  Skin:    General: Skin is warm and dry.     Capillary Refill: Capillary refill takes less than 2 seconds.  Neurological:     Mental Status: He is alert.  Psychiatric:        Mood and Affect: Mood normal.     (all labs ordered are listed, but only abnormal results are displayed) Labs Reviewed  CBC WITH DIFFERENTIAL/PLATELET - Abnormal; Notable for the following components:      Result Value   HCT 38.4 (*)    MCHC 36.7 (*)    All other components within normal limits  COMPREHENSIVE METABOLIC PANEL WITH GFR - Abnormal; Notable for the following components:   BUN 21 (*)  All other components within normal limits  GROUP A STREP BY PCR    EKG: None  Radiology: CT Soft Tissue Neck W Contrast Result Date: 06/14/2024 EXAM: CT NECK WITH CONTRAST 06/13/2024 11:32:54 PM TECHNIQUE: CT of the neck was performed with the administration of intravenous contrast. Multiplanar reformatted images are provided for review. Automated exposure control, iterative reconstruction, and/or weight based adjustment of the mA/kV was utilized to reduce the radiation dose to as low as reasonably achievable. COMPARISON: None available. CLINICAL HISTORY: Epiglottitis or tonsillitis suspected FINDINGS: AERODIGESTIVE TRACT: No discrete mass. No edema. SALIVARY GLANDS: The parotid and submandibular glands are unremarkable. THYROID: Unremarkable. LYMPH NODES: No  suspicious cervical lymphadenopathy. SOFT TISSUES: No mass or fluid collection. BRAIN, ORBITS, SINUSES AND MASTOIDS: No acute abnormality. LUNGS AND MEDIASTINUM: No acute abnormality. BONES: No focal bone abnormality. IMPRESSION: 1. No acute findings in the neck. Electronically signed by: Gilmore Molt MD 06/14/2024 12:06 AM EST RP Workstation: [id number]     Procedures   Medications Ordered in the ED  dexamethasone (DECADRON) 1 MG/ML solution 10 mg (has no administration in time range)  amoxicillin-clavulanate (AUGMENTIN) 875-125 MG per tablet 1 tablet (has no administration in time range)  iohexol (OMNIPAQUE) 300 MG/ML solution 75 mL (75 mLs Intravenous Contrast Given 06/13/24 2333)                                    Medical Decision Making Amount and/or Complexity of Data Reviewed Labs: ordered. Radiology: ordered.  Risk Prescription drug management.   This patient presents to the ED for concern of dental concern.  Differential diagnosis includes dental infection, dental abscess, peritonsillar abscess    Additional history obtained:  Additional history obtained from chart review   Lab Tests:  I Ordered, and personally interpreted labs.  The pertinent results include: CBC unremarkable, CMP unremarkable, group A strep negative   Imaging Studies ordered:  I ordered imaging studies including CT soft tissue neck I independently visualized and interpreted imaging which showed no acute findings I agree with the radiologist interpretation   Medicines ordered and prescription drug management:  I ordered medication including Decadron, Augmentin  for dental infection  Reevaluation of the patient after these medicines showed that the patient improved I have reviewed the patients home medicines and have made adjustments as needed   Problem List / ED Course:  Patient with past medical history significant for asthma and GERD presents to the ED with concerns of dental  pain/swelling. States he noticed swelling and pain to just posterior the left wisdom tooth for a few days, unsure how many days. No fevers or reported drainage from this area. States that the area of swelling is making it hard to eat, drink, and speak normally. Trismus present. Physical exam shows and area of swelling and possibly thinning skin to the left posterior mouth along the tonsillar pillar. Tonsils are 2+ with no exudate present. There is slight anterior cervical change lymphadenopathy on my exam. Uvula appears slightly shifted to right. CBC and CMP are unremarkable. Group A strep is negative. CT soft tissue neck is currently pending. CT imaging is thankfully reassuring. However, given his exam and the area of redness, swelling, and the noted trismus, I am concerned there may be infection in this area. I will start patient on Augmentin and administer a one time dose of steroids here in the ED. Advised close follow up with PCP and return precautions  also advised. He is otherwise stable for outpatient follow up and discharged home.   Social Determinants of Health:  None  Final diagnoses:  Dental infection    ED Discharge Orders          Ordered    amoxicillin-clavulanate (AUGMENTIN) 875-125 MG tablet  Every 12 hours        06/14/24 0014               Avory Mimbs A, PA-C 06/14/24 0018    Rogelia Jerilynn RAMAN, MD 06/18/24 [phone]

## 2024-06-13 NOTE — ED Provider Notes (Incomplete)
 [place] [facility] DEPARTMENT AT [facility] Provider Note   CSN: [id number] Arrival date & time: 06/13/24  2021     Patient presents with: Dental Problem   Jose Humphrey is a 23 y.o. male.  Patient with past history significant for asthma, GERD presents to the emergency department with concerns of dental problem.  Reports that he noticed pain and swelling to the left hand of his the last 2 days that is worsening.  He denies any fever, chills, body aches, congestion, or sore throat.  He denies any history of similar symptoms.  He has not tried any medications for this area pain and swelling is endorsed.  HPI     Prior to Admission medications   Medication Sig Start Date End Date Taking? Authorizing Provider  albuterol (PROVENTIL HFA;VENTOLIN HFA) 108 (90 BASE) MCG/ACT inhaler Inhale 2 puffs into the lungs every 6 (six) hours as needed for wheezing.    [provider]  hydrOXYzine  (VISTARIL ) 25 MG capsule TAKE 1 CAPSULE (25 MG TOTAL) BY MOUTH AT BEDTIME AS NEEDED. 03/02/24   Cyndi, Manuelita, PA-C  traZODone  (DESYREL ) 50 MG tablet Take 0.5-1 tablets (25-50 mg total) by mouth at bedtime as needed for sleep. 11/22/23   Cyndi Manuelita, PA-C    Allergies: Patient has no known allergies.    Review of Systems  All other systems reviewed and are negative.   Updated Vital Signs BP 96/65   Pulse 73   Temp 98.8 F (37.1 C) (Oral)   Resp 18   SpO2 99%   Physical Exam Vitals and nursing note reviewed.  Constitutional:      General: He is not in acute distress.    Appearance: He is well-developed.  HENT:     Head: Normocephalic and atraumatic.     Mouth/Throat:     Tonsils: Tonsillar abscess present. 2+ on the right. 2+ on the left.      Comments: Swelling and erythema of the left tonsillar pillar with some areas of thinning skin appearing to contain possible fluid. Tonsils are 2+ bilaterally with rightward displacement of uvula. Trismus present. Eyes:      Conjunctiva/sclera: Conjunctivae normal.  Cardiovascular:     Rate and Rhythm: Normal rate and regular rhythm.     Heart sounds: No murmur heard. Pulmonary:     Effort: Pulmonary effort is normal. No respiratory distress.     Breath sounds: Normal breath sounds.  Abdominal:     Palpations: Abdomen is soft.     Tenderness: There is no abdominal tenderness.  Musculoskeletal:        General: No swelling.     Cervical back: Neck supple.  Skin:    General: Skin is warm and dry.     Capillary Refill: Capillary refill takes less than 2 seconds.  Neurological:     Mental Status: He is alert.  Psychiatric:        Mood and Affect: Mood normal.     (all labs ordered are listed, but only abnormal results are displayed) Labs Reviewed  CBC WITH DIFFERENTIAL/PLATELET  COMPREHENSIVE METABOLIC PANEL WITH GFR    EKG: None  Radiology: No results found.   Procedures   Medications Ordered in the ED - No data to display                                  Medical Decision Making Amount and/or Complexity of Data Reviewed Labs:  ordered. Radiology: ordered.  Risk Prescription drug management.   This patient presents to the ED for concern of dental concern.  Differential diagnosis includes dental infection, dental abscess, peritonsillar abscess    Additional history obtained:  Additional history obtained from chart review   Lab Tests:  I Ordered, and personally interpreted labs.  The pertinent results include: CBC unremarkable, CMP unremarkable, group A strep negative   Imaging Studies ordered:  I ordered imaging studies including CT soft tissue neck I independently visualized and interpreted imaging which showed *** I agree with the radiologist interpretation   Medicines ordered and prescription drug management:  I ordered medication including ***  for ***  Reevaluation of the patient after these medicines showed that the patient  {resolved/improved/worsened:23923::improved} I have reviewed the patients home medicines and have made adjustments as needed   Problem List / ED Course:  Patient with past medical history significant for asthma and GERD presents to the ED with concerns of dental pain/swelling. States he noticed swelling and pain to just posterior the left wisdom tooth for a few days, unsure how many days. No fevers or reported drainage from this area. States that the area of swelling is making it hard to eat, drink, and speak normally. Trismus present. Physical exam shows and area of swelling and possibly thinning skin to the left posterior mouth along the tonsillar pillar. Tonsils are 2+ with no exudate present. There is slight anterior cervical change lymphadenopathy on my exam. Uvula appears slightly shifted to right. CBC and CMP are unremarkable. Group A strep is negative.    Social Determinants of Health:    Final diagnoses:  None    ED Discharge Orders     None

## 2024-06-13 NOTE — ED Notes (Signed)
 Patient transported to CT

## 2024-06-13 NOTE — ED Triage Notes (Signed)
 Pt states that he has dental pain to bottom left side for the past few days, no fevers.

## 2024-06-14 MED ORDER — DEXAMETHASONE 10 MG/ML FOR PEDIATRIC ORAL USE
10.0000 mg | Freq: Once | INTRAMUSCULAR | Status: AC
  Administered 2024-06-14: 10 mg via ORAL

## 2024-06-14 MED ORDER — AMOXICILLIN-POT CLAVULANATE 875-125 MG PO TABS: 1.0000 | ORAL_TABLET | Freq: Two times a day (BID) | ORAL | 0 refills | Status: AC

## 2024-06-14 MED ORDER — AMOXICILLIN-POT CLAVULANATE 875-125 MG PO TABS
1.0000 | ORAL_TABLET | Freq: Once | ORAL | Status: AC
  Administered 2024-06-14: 1 via ORAL
  Filled 2024-06-14: qty 1

## 2024-06-14 NOTE — Discharge Instructions (Signed)
 You were seen in the ER today for concerns of pain in the left lower side of your mouth. There appears to be redness and swelling in this area and with your trouble chewing, I am concerned for infection. Your CT scan appears reassuring with no signs of an abscess in this space, however, I am concerned that there is infection with the amount of pain you have been experiencing here. I have started you on antibiotics and have given you your first dose of antibiotics here in the ER. Please take your antibiotics as prescribed. For any concerns of new or worsening symptoms, return to the ER.
